# Patient Record
Sex: Female | Born: 1985
Health system: Southern US, Community
[De-identification: ages and names within clinical notes are randomized; demographics above are authoritative.]

## PROBLEM LIST (undated history)

## (undated) DIAGNOSIS — Z789 Other specified health status: Secondary | ICD-10-CM

## (undated) HISTORY — PX: NO PAST SURGERIES: SHX2092

---

## 2012-11-25 LAB — OB RESULTS CONSOLE HEPATITIS B SURFACE ANTIGEN: Hepatitis B Surface Ag: NEGATIVE

## 2012-11-25 LAB — OB RESULTS CONSOLE ANTIBODY SCREEN: ANTIBODY SCREEN: NEGATIVE

## 2012-11-25 LAB — OB RESULTS CONSOLE ABO/RH: RH Type: POSITIVE

## 2012-11-25 LAB — OB RESULTS CONSOLE RPR: RPR: NONREACTIVE

## 2012-11-25 LAB — OB RESULTS CONSOLE GC/CHLAMYDIA
Chlamydia: NEGATIVE
GC PROBE AMP, GENITAL: NEGATIVE

## 2012-11-25 LAB — OB RESULTS CONSOLE HIV ANTIBODY (ROUTINE TESTING): HIV: NONREACTIVE

## 2012-11-25 LAB — OB RESULTS CONSOLE RUBELLA ANTIBODY, IGM: Rubella: IMMUNE

## 2012-12-13 ENCOUNTER — Inpatient Hospital Stay (HOSPITAL_COMMUNITY): Admission: AD | Admit: 2012-12-13 | Payer: Self-pay | Source: Ambulatory Visit | Admitting: Obstetrics and Gynecology

## 2013-02-23 NOTE — L&D Delivery Note (Signed)
Delivery Note  SVD viable female Apgars 9,9 over 2nd degree ML episiotomy.  Placenta delivered spontaneously intact with 3VC. Repair with 2-0 Chromic with good support and hemostasis noted and R/V exam confirms.  PH art was sent.  [place] cord blood was not done.  Mother and baby were doing well.  EBL 350cc  Candice Campavid Shelisha Gautier, MD

## 2013-05-29 LAB — OB RESULTS CONSOLE GBS: GBS: NEGATIVE

## 2013-06-20 ENCOUNTER — Encounter (HOSPITAL_COMMUNITY): Payer: BC Managed Care – PPO | Admitting: Anesthesiology

## 2013-06-20 ENCOUNTER — Inpatient Hospital Stay (HOSPITAL_COMMUNITY)
Admission: AD | Admit: 2013-06-20 | Discharge: 2013-06-22 | DRG: 775 | Disposition: A | Discharge status: Home / Self Care | Payer: BC Managed Care – PPO | Source: Ambulatory Visit | Attending: Obstetrics and Gynecology | Admitting: Obstetrics and Gynecology

## 2013-06-20 ENCOUNTER — Encounter (HOSPITAL_COMMUNITY): Payer: Self-pay | Admitting: *Deleted

## 2013-06-20 ENCOUNTER — Inpatient Hospital Stay (HOSPITAL_COMMUNITY): Payer: BC Managed Care – PPO | Admitting: Anesthesiology

## 2013-06-20 DIAGNOSIS — O878 Other venous complications in the puerperium: Secondary | ICD-10-CM | POA: Diagnosis present

## 2013-06-20 DIAGNOSIS — K649 Unspecified hemorrhoids: Secondary | ICD-10-CM | POA: Diagnosis present

## 2013-06-20 DIAGNOSIS — Z8249 Family history of ischemic heart disease and other diseases of the circulatory system: Secondary | ICD-10-CM

## 2013-06-20 DIAGNOSIS — Z833 Family history of diabetes mellitus: Secondary | ICD-10-CM

## 2013-06-20 HISTORY — DX: Other specified health status: Z78.9

## 2013-06-20 LAB — CBC
HCT: 42.7 % (ref 36.0–46.0)
HEMOGLOBIN: 14.7 g/dL (ref 12.0–15.0)
MCH: 31.7 pg (ref 26.0–34.0)
MCHC: 34.4 g/dL (ref 30.0–36.0)
MCV: 92 fL (ref 78.0–100.0)
Platelets: 203 10*3/uL (ref 150–400)
RBC: 4.64 MIL/uL (ref 3.87–5.11)
RDW: 14.6 % (ref 11.5–15.5)
WBC: 13 10*3/uL — ABNORMAL HIGH (ref 4.0–10.5)

## 2013-06-20 LAB — RPR

## 2013-06-20 MED ORDER — CITRIC ACID-SODIUM CITRATE 334-500 MG/5ML PO SOLN: 30.0000 mL | ORAL | Status: DC | PRN

## 2013-06-20 MED ORDER — PHENYLEPHRINE 40 MCG/ML (10ML) SYRINGE FOR IV PUSH (FOR BLOOD PRESSURE SUPPORT)
80.0000 ug | PREFILLED_SYRINGE | INTRAVENOUS | Status: DC | PRN
  Filled 2013-06-20: qty 2
  Filled 2013-06-20: qty 10

## 2013-06-20 MED ORDER — ACETAMINOPHEN 325 MG PO TABS: 650.0000 mg | ORAL_TABLET | ORAL | Status: DC | PRN

## 2013-06-20 MED ORDER — OXYCODONE-ACETAMINOPHEN 5-325 MG PO TABS: 1.0000 | ORAL_TABLET | ORAL | Status: DC | PRN

## 2013-06-20 MED ORDER — LIDOCAINE HCL (PF) 1 % IJ SOLN
INTRAMUSCULAR | Status: DC | PRN
  Administered 2013-06-20 (×2): 8 mL

## 2013-06-20 MED ORDER — ONDANSETRON HCL 4 MG/2ML IJ SOLN
4.0000 mg | Freq: Four times a day (QID) | INTRAMUSCULAR | Status: DC | PRN
  Administered 2013-06-20: 4 mg via INTRAVENOUS
  Filled 2013-06-20: qty 2

## 2013-06-20 MED ORDER — OXYCODONE-ACETAMINOPHEN 5-325 MG PO TABS
1.0000 | ORAL_TABLET | ORAL | Status: DC | PRN
  Administered 2013-06-21: 1 via ORAL
  Filled 2013-06-20: qty 1

## 2013-06-20 MED ORDER — PRENATAL MULTIVITAMIN CH
1.0000 | ORAL_TABLET | Freq: Every day | ORAL | Status: DC
  Administered 2013-06-21: 1 via ORAL
  Filled 2013-06-20: qty 1

## 2013-06-20 MED ORDER — ONDANSETRON HCL 4 MG/2ML IJ SOLN: 4.0000 mg | INTRAMUSCULAR | Status: DC | PRN

## 2013-06-20 MED ORDER — LACTATED RINGERS IV SOLN
500.0000 mL | INTRAVENOUS | Status: DC | PRN
Start: 2013-06-20 — End: 2013-06-20

## 2013-06-20 MED ORDER — OXYTOCIN 40 UNITS IN LACTATED RINGERS INFUSION - SIMPLE MED: 62.5000 mL/h | INTRAVENOUS | Status: DC

## 2013-06-20 MED ORDER — ZOLPIDEM TARTRATE 5 MG PO TABS: 5.0000 mg | ORAL_TABLET | Freq: Every evening | ORAL | Status: DC | PRN

## 2013-06-20 MED ORDER — WITCH HAZEL-GLYCERIN EX PADS
1.0000 "application " | MEDICATED_PAD | CUTANEOUS | Status: DC | PRN
  Administered 2013-06-21: 1 via TOPICAL

## 2013-06-20 MED ORDER — IBUPROFEN 600 MG PO TABS
600.0000 mg | ORAL_TABLET | Freq: Four times a day (QID) | ORAL | Status: DC
  Administered 2013-06-20 – 2013-06-22 (×5): 600 mg via ORAL
  Filled 2013-06-20 (×6): qty 1

## 2013-06-20 MED ORDER — TETANUS-DIPHTH-ACELL PERTUSSIS 5-2.5-18.5 LF-MCG/0.5 IM SUSP: 0.5000 mL | Freq: Once | INTRAMUSCULAR | Status: DC

## 2013-06-20 MED ORDER — FLEET ENEMA 7-19 GM/118ML RE ENEM: 1.0000 | ENEMA | RECTAL | Status: DC | PRN

## 2013-06-20 MED ORDER — SENNOSIDES-DOCUSATE SODIUM 8.6-50 MG PO TABS
2.0000 | ORAL_TABLET | ORAL | Status: DC
  Administered 2013-06-20 – 2013-06-22 (×2): 2 via ORAL
  Filled 2013-06-20 (×2): qty 2

## 2013-06-20 MED ORDER — FENTANYL 2.5 MCG/ML BUPIVACAINE 1/10 % EPIDURAL INFUSION (WH - ANES)
INTRAMUSCULAR | Status: AC
  Filled 2013-06-20: qty 125

## 2013-06-20 MED ORDER — FENTANYL 2.5 MCG/ML BUPIVACAINE 1/10 % EPIDURAL INFUSION (WH - ANES)
INTRAMUSCULAR | Status: DC | PRN
  Administered 2013-06-20: 14 mL/h via EPIDURAL

## 2013-06-20 MED ORDER — LACTATED RINGERS IV SOLN
500.0000 mL | Freq: Once | INTRAVENOUS | Status: AC
  Administered 2013-06-20: 800 mL via INTRAVENOUS

## 2013-06-20 MED ORDER — TERBUTALINE SULFATE 1 MG/ML IJ SOLN: 0.2500 mg | Freq: Once | INTRAMUSCULAR | Status: DC | PRN

## 2013-06-20 MED ORDER — MEASLES, MUMPS & RUBELLA VAC ~~LOC~~ INJ: 0.5000 mL | INJECTION | Freq: Once | SUBCUTANEOUS | Status: DC

## 2013-06-20 MED ORDER — BENZOCAINE-MENTHOL 20-0.5 % EX AERO
1.0000 "application " | INHALATION_SPRAY | CUTANEOUS | Status: DC | PRN
  Filled 2013-06-20: qty 56

## 2013-06-20 MED ORDER — EPHEDRINE 5 MG/ML INJ
10.0000 mg | INTRAVENOUS | Status: DC | PRN
  Filled 2013-06-20: qty 2

## 2013-06-20 MED ORDER — MEDROXYPROGESTERONE ACETATE 150 MG/ML IM SUSP: 150.0000 mg | INTRAMUSCULAR | Status: DC | PRN

## 2013-06-20 MED ORDER — DIBUCAINE 1 % RE OINT: 1.0000 "application " | TOPICAL_OINTMENT | RECTAL | Status: DC | PRN

## 2013-06-20 MED ORDER — LIDOCAINE HCL (PF) 1 % IJ SOLN
30.0000 mL | INTRAMUSCULAR | Status: DC | PRN
  Filled 2013-06-20: qty 30

## 2013-06-20 MED ORDER — PHENYLEPHRINE 40 MCG/ML (10ML) SYRINGE FOR IV PUSH (FOR BLOOD PRESSURE SUPPORT)
80.0000 ug | PREFILLED_SYRINGE | INTRAVENOUS | Status: DC | PRN
  Filled 2013-06-20: qty 2

## 2013-06-20 MED ORDER — EPHEDRINE 5 MG/ML INJ
INTRAVENOUS | Status: AC
  Filled 2013-06-20: qty 4

## 2013-06-20 MED ORDER — SIMETHICONE 80 MG PO CHEW: 80.0000 mg | CHEWABLE_TABLET | ORAL | Status: DC | PRN

## 2013-06-20 MED ORDER — EPHEDRINE 5 MG/ML INJ
10.0000 mg | INTRAVENOUS | Status: DC | PRN
  Administered 2013-06-20: 10 mg via INTRAVENOUS
  Filled 2013-06-20: qty 2

## 2013-06-20 MED ORDER — DIPHENHYDRAMINE HCL 25 MG PO CAPS: 25.0000 mg | ORAL_CAPSULE | Freq: Four times a day (QID) | ORAL | Status: DC | PRN

## 2013-06-20 MED ORDER — OXYTOCIN BOLUS FROM INFUSION: 500.0000 mL | INTRAVENOUS | Status: DC

## 2013-06-20 MED ORDER — FENTANYL 2.5 MCG/ML BUPIVACAINE 1/10 % EPIDURAL INFUSION (WH - ANES)
14.0000 mL/h | INTRAMUSCULAR | Status: DC | PRN
  Administered 2013-06-20: 14 mL/h via EPIDURAL
  Filled 2013-06-20: qty 125

## 2013-06-20 MED ORDER — IBUPROFEN 600 MG PO TABS: 600.0000 mg | ORAL_TABLET | Freq: Four times a day (QID) | ORAL | Status: DC | PRN

## 2013-06-20 MED ORDER — ONDANSETRON HCL 4 MG PO TABS: 4.0000 mg | ORAL_TABLET | ORAL | Status: DC | PRN

## 2013-06-20 MED ORDER — DIPHENHYDRAMINE HCL 50 MG/ML IJ SOLN: 12.5000 mg | INTRAMUSCULAR | Status: DC | PRN

## 2013-06-20 MED ORDER — OXYTOCIN 40 UNITS IN LACTATED RINGERS INFUSION - SIMPLE MED
1.0000 m[IU]/min | INTRAVENOUS | Status: DC
  Administered 2013-06-20: 4 m[IU]/min via INTRAVENOUS
  Administered 2013-06-20: 2 m[IU]/min via INTRAVENOUS
  Filled 2013-06-20: qty 1000

## 2013-06-20 MED ORDER — LACTATED RINGERS IV SOLN
INTRAVENOUS | Status: DC
  Administered 2013-06-20: 1000 mL via INTRAVENOUS

## 2013-06-20 MED ORDER — LANOLIN HYDROUS EX OINT
TOPICAL_OINTMENT | CUTANEOUS | Status: DC | PRN
Start: 2013-06-20 — End: 2013-06-22

## 2013-06-20 NOTE — Anesthesia Preprocedure Evaluation (Signed)

## 2013-06-20 NOTE — H&P (Signed)
Kaneisha Virgie DadJhurry is a 28 y.o. female presenting for labor sxs.  Admitted this am in active labor.  Prg uncomplicated.  GBS-. History OB History   Grav Para Term Preterm Abortions TAB SAB Ect Mult Living   2    1 1          Past Medical History  Diagnosis Date  . Medical history non-contributory    Past Surgical History  Procedure Laterality Date  . No past surgeries     Family History: family history includes Diabetes in her father and mother; Hypertension in her father and mother. Social History:  reports that she has never smoked. She does not have any smokeless tobacco history on file. She reports that she does not drink alcohol or use illicit drugs.   Prenatal Transfer Tool  Maternal Diabetes: No Genetic Screening: Normal Maternal Ultrasounds/Referrals: Normal Fetal Ultrasounds or other Referrals:  None Maternal Substance Abuse:  No Significant Maternal Medications:  None Significant Maternal Lab Results:  None Other Comments:  None  ROS  Dilation: 4 Effacement (%): 100 Station: -1 Exam by:: Dr Rana SnareLowe Blood pressure 123/79, pulse 85, temperature 97.9 F (36.6 C), temperature source Oral, resp. rate 20, height 5\' 7"  (1.702 m), weight 78.926 kg (174 lb), SpO2 98.00%. Exam Physical Exam  Prenatal labs: ABO, Rh: O/Positive/-- (10/03 0000) Antibody: Negative (10/03 0000) Rubella: Immune (10/03 0000) RPR: Nonreactive (10/03 0000)  HBsAg: Negative (10/03 0000)  HIV: Non-reactive (10/03 0000)  GBS: Negative (04/06 0000)   Assessment/Plan: IUP term in active labor. Anticipate SVD   Turner DanielsDavid C Mario Voong 06/20/2013, 8:56 AM

## 2013-06-20 NOTE — MAU Note (Signed)
Contractions and back pain.

## 2013-06-20 NOTE — Anesthesia Procedure Notes (Signed)
Epidural Patient location during procedure: OB Start time: 06/20/2013 8:11 AM End time: 06/20/2013 8:15 AM  Staffing Anesthesiologist: Leilani AbleHATCHETT, Leilynn Pilat Performed by: anesthesiologist   Preanesthetic Checklist Completed: patient identified, surgical consent, pre-op evaluation, timeout performed, IV checked, risks and benefits discussed and monitors and equipment checked  Epidural Patient position: sitting Prep: site prepped and draped and DuraPrep Patient monitoring: continuous pulse ox and blood pressure Approach: midline Location: L3-L4 Injection technique: LOR air  Needle:  Needle type: Tuohy  Needle gauge: 17 G Needle length: 9 cm and 9 Needle insertion depth: 5 cm cm Catheter type: closed end flexible Catheter size: 19 Gauge Catheter at skin depth: 10 cm Test dose: negative and Other  Assessment Sensory level: T9 Events: blood not aspirated, injection not painful, no injection resistance, negative IV test and no paresthesia  Additional Notes Reason for block:procedure for pain

## 2013-06-20 NOTE — Lactation Note (Signed)
This note was copied from the chart of Terri Evans. Lactation Consultation Note  Patient Name: Terri Evans Today's Date: 06/20/2013 Reason for consult: Initial assessment of this primipara and her newborn at 5 hours of age.  Baby had initial LATCH score=10 and mom reports just finishing a feeding for 31 minutes but is asking for help swaddling baby.  LC demonstrated swaddling and reviewed other calming and comfort measures for baby between feedings.  LC encouraged STS and cue feedings.  Mom says she was shown how to perform hand expression to obtain colostrum and reports baby latching with strong sucking on both breasts.   LC provided [facility] Resource brochure and reviewed [facility] and list of community and web site resources. LC encouraged review of Baby and Me pp 9, 14 and 20-25 for STS and BF information.   Maternal Data Formula Feeding for Exclusion: No Infant to breast within first hour of birth: Yes (initial LATCH score=10) Has patient been taught Hand Expression?: Yes Does the patient have breastfeeding experience prior to this delivery?: No  Feeding Feeding Type: Breast Fed Length of feed: 31 min (reported by parents; 10 on one breast, 21 on other)  LATCH Score/Interventions           initial LATCH score=10 after delivery           Lactation Tools Discussed/Used   STS, cue feedings, hand expression Swaddling technique, calming and comfort measures for baby  Consult Status Consult Status: Follow-up Date: 06/21/13 Follow-up type: In-patient    Terri Evans 06/20/2013, 10:54 PM

## 2013-06-21 LAB — CBC
HEMATOCRIT: 34.6 % — AB (ref 36.0–46.0)
Hemoglobin: 11.9 g/dL — ABNORMAL LOW (ref 12.0–15.0)
MCH: 31.9 pg (ref 26.0–34.0)
MCHC: 34.4 g/dL (ref 30.0–36.0)
MCV: 92.8 fL (ref 78.0–100.0)
Platelets: 173 10*3/uL (ref 150–400)
RBC: 3.73 MIL/uL — ABNORMAL LOW (ref 3.87–5.11)
RDW: 14.8 % (ref 11.5–15.5)
WBC: 16.4 10*3/uL — ABNORMAL HIGH (ref 4.0–10.5)

## 2013-06-21 MED ORDER — HYDROCORTISONE ACE-PRAMOXINE 1-1 % RE FOAM
1.0000 | Freq: Two times a day (BID) | RECTAL | Status: DC
Start: 2013-06-21 — End: 2013-06-22
  Administered 2013-06-21: 1 via RECTAL
  Filled 2013-06-21: qty 10

## 2013-06-21 NOTE — Anesthesia Postprocedure Evaluation (Signed)
Anesthesia Post Note  Patient: Terri Evans  Procedure(s) Performed: * No procedures listed *  Anesthesia type: Epidural  Patient location: Mother/Baby  Post pain: Pain level controlled  Post assessment: Post-op Vital signs reviewed  Last Vitals:  Filed Vitals:   06/21/13 0110  BP: 97/62  Pulse: 96  Temp: 36.4 C  Resp: 18    Post vital signs: Reviewed  Level of consciousness:alert  Complications: No apparent anesthesia complications

## 2013-06-21 NOTE — Progress Notes (Signed)
Post Partum Day 1 Subjective: no complaints, up ad lib, voiding and tolerating PO  Objective: Blood pressure 97/62, pulse 96, temperature 97.5 F (36.4 C), temperature source Oral, resp. rate 18, height 5\' 7"  (1.702 m), weight 174 lb (78.926 kg), SpO2 98.00%, unknown if currently breastfeeding.  Physical Exam:  General: alert and cooperative Lochia: appropriate Uterine Fundus: firm Incision: perineum intact, small hemorrhoid noted DVT Evaluation: No evidence of DVT seen on physical exam. Negative Homan's sign. No cords or calf tenderness. No significant calf/ankle edema.   Recent Labs  06/20/13 0550 06/21/13 0612  HGB 14.7 11.9*  HCT 42.7 34.6*    Assessment/Plan: Plan for discharge tomorrow   LOS: 1 day   Terri Evans 06/21/2013, 8:01 AM

## 2013-06-22 MED ORDER — HYDROCORTISONE ACE-PRAMOXINE 1-1 % RE FOAM: 1.0000 | Freq: Two times a day (BID) | RECTAL | Status: DC

## 2013-06-22 MED ORDER — IBUPROFEN 600 MG PO TABS: 600.0000 mg | ORAL_TABLET | Freq: Four times a day (QID) | ORAL | Status: DC

## 2013-06-22 NOTE — Discharge Summary (Signed)
Obstetric Discharge Summary Reason for Admission: onset of labor Prenatal Procedures: ultrasound Intrapartum Procedures: spontaneous vaginal delivery Postpartum Procedures: none Complications-Operative and Postpartum: 2 degree perineal laceration Hemoglobin  Date Value Ref Range Status  06/21/2013 11.9* 12.0 - 15.0 g/dL Final     REPEATED TO VERIFY     DELTA CHECK NOTED     HCT  Date Value Ref Range Status  06/21/2013 34.6* 36.0 - 46.0 % Final    Physical Exam:  General: alert and cooperative Lochia: appropriate Uterine Fundus: firm Incision: healing well DVT Evaluation: No evidence of DVT seen on physical exam. Negative Homan's sign. No cords or calf tenderness. No significant calf/ankle edema.  Discharge Diagnoses: Term Pregnancy-delivered  Discharge Information: Date: 06/22/2013 Activity: pelvic rest Diet: routine Medications: PNV, Ibuprofen and proctofoam HC Condition: stable Instructions: refer to practice specific booklet Discharge to: home   Newborn Data: Live born female  Birth Weight: 7 lb 10 oz (3459 g) APGAR: 9, 9  Home with mother.  Terri BlonderCarol G [name] 06/22/2013, 8:09 AM

## 2013-06-22 NOTE — Lactation Note (Signed)
This note was copied from the chart of Terri Evans. Lactation Consultation Note  Patient Name: Terri Kit Virgie DadJhurry [id number]'s Date: 06/22/2013 Reason for consult: Follow-up assessment Follow-up appointment prior to discharge, baby 40 hours of life. Mom just finishing up nursing, mom and patients' nurse reports nursing/latching going well. Mom reports baby is cluster feeding. Mom denies nipple/breast pain. Mom encourage to feed baby with cues, and understands cluster feeding. Discussed engorgement prevention/treatment. Mom aware of OP/BFSG services, and enc to call with concerns. Mom asked for a hand pump, given with instructions. Mom referred to Baby and Me booklet for milk storage parameters and number of wet and soiled diapers to expect. Reviewed when to offer first bottle in preparation to return to work.   Maternal Data    Feeding Feeding Type:  (Mom just finished nursing.) Length of feed: 30 min  LATCH Score/Interventions Latch:  (Didn't see latch, mom and patient's nurse states baby latches well, nursing going well.)                    Lactation Tools Discussed/Used     Consult Status Consult Status: Complete Follow-up type: Call as needed    Sherlyn HayJennifer D Edynn Gillock 06/22/2013, 10:01 AM

## 2013-12-25 ENCOUNTER — Encounter (HOSPITAL_COMMUNITY): Payer: Self-pay | Admitting: *Deleted

## 2016-01-01 DIAGNOSIS — Z01419 Encounter for gynecological examination (general) (routine) without abnormal findings: Secondary | ICD-10-CM | POA: Diagnosis not present

## 2016-01-01 DIAGNOSIS — Z6823 Body mass index (BMI) 23.0-23.9, adult: Secondary | ICD-10-CM | POA: Diagnosis not present

## 2016-08-12 DIAGNOSIS — N911 Secondary amenorrhea: Secondary | ICD-10-CM | POA: Diagnosis not present

## 2016-08-21 DIAGNOSIS — Z3480 Encounter for supervision of other normal pregnancy, unspecified trimester: Secondary | ICD-10-CM | POA: Diagnosis not present

## 2016-08-21 DIAGNOSIS — Z3A09 9 weeks gestation of pregnancy: Secondary | ICD-10-CM | POA: Diagnosis not present

## 2016-08-21 DIAGNOSIS — Z3491 Encounter for supervision of normal pregnancy, unspecified, first trimester: Secondary | ICD-10-CM | POA: Diagnosis not present

## 2016-08-21 LAB — OB RESULTS CONSOLE ABO/RH: RH Type: POSITIVE

## 2016-08-21 LAB — OB RESULTS CONSOLE HIV ANTIBODY (ROUTINE TESTING): HIV: NONREACTIVE

## 2016-08-21 LAB — OB RESULTS CONSOLE RUBELLA ANTIBODY, IGM: RUBELLA: IMMUNE

## 2016-08-21 LAB — OB RESULTS CONSOLE RPR: RPR: NONREACTIVE

## 2016-08-21 LAB — OB RESULTS CONSOLE ANTIBODY SCREEN: ANTIBODY SCREEN: NEGATIVE

## 2016-08-21 LAB — OB RESULTS CONSOLE GC/CHLAMYDIA
Chlamydia: NEGATIVE
GC PROBE AMP, GENITAL: NEGATIVE

## 2016-08-21 LAB — OB RESULTS CONSOLE HEPATITIS B SURFACE ANTIGEN: HEP B S AG: NEGATIVE

## 2016-09-07 DIAGNOSIS — Z3480 Encounter for supervision of other normal pregnancy, unspecified trimester: Secondary | ICD-10-CM | POA: Diagnosis not present

## 2016-09-07 DIAGNOSIS — R300 Dysuria: Secondary | ICD-10-CM | POA: Diagnosis not present

## 2016-09-07 DIAGNOSIS — Z3491 Encounter for supervision of normal pregnancy, unspecified, first trimester: Secondary | ICD-10-CM | POA: Diagnosis not present

## 2016-09-07 DIAGNOSIS — Z113 Encounter for screening for infections with a predominantly sexual mode of transmission: Secondary | ICD-10-CM | POA: Diagnosis not present

## 2016-09-14 DIAGNOSIS — Z3682 Encounter for antenatal screening for nuchal translucency: Secondary | ICD-10-CM | POA: Diagnosis not present

## 2016-09-14 DIAGNOSIS — Z3491 Encounter for supervision of normal pregnancy, unspecified, first trimester: Secondary | ICD-10-CM | POA: Diagnosis not present

## 2016-09-14 DIAGNOSIS — Z36 Encounter for antenatal screening for chromosomal anomalies: Secondary | ICD-10-CM | POA: Diagnosis not present

## 2016-10-07 DIAGNOSIS — Z3480 Encounter for supervision of other normal pregnancy, unspecified trimester: Secondary | ICD-10-CM | POA: Diagnosis not present

## 2016-10-07 DIAGNOSIS — Z363 Encounter for antenatal screening for malformations: Secondary | ICD-10-CM | POA: Diagnosis not present

## 2016-10-07 DIAGNOSIS — Z3A16 16 weeks gestation of pregnancy: Secondary | ICD-10-CM | POA: Diagnosis not present

## 2016-10-29 DIAGNOSIS — Z363 Encounter for antenatal screening for malformations: Secondary | ICD-10-CM | POA: Diagnosis not present

## 2016-11-30 DIAGNOSIS — O4692 Antepartum hemorrhage, unspecified, second trimester: Secondary | ICD-10-CM | POA: Diagnosis not present

## 2016-11-30 DIAGNOSIS — Z3A23 23 weeks gestation of pregnancy: Secondary | ICD-10-CM | POA: Diagnosis not present

## 2016-12-28 DIAGNOSIS — Z3480 Encounter for supervision of other normal pregnancy, unspecified trimester: Secondary | ICD-10-CM | POA: Diagnosis not present

## 2016-12-28 DIAGNOSIS — Z23 Encounter for immunization: Secondary | ICD-10-CM | POA: Diagnosis not present

## 2017-02-23 NOTE — L&D Delivery Note (Signed)
  CTSP for fetal bradycardia to 50 bpm x 5 minutes.  Pt now complete (rapidly).  FHR not responding to postion change or IVF  Vtx at +2 station and LOA.  Pt desires VE.  I discussed the R&B of VE including but not limited to injury to fetus but the potential benefit of expedited delivery.  She gives her informed consent and wishes to proceed. On the 1st pull delivered viable female apgars 9,9 over 2nd degree perineal laceration   Placenta delivered spontaneously intact with 3VC.  Appeared to have marginal placental clot, and gush of blood was noted at delivery c/w partial abruption Repair with 2-0 Chromic with good support and hemostasis noted and R/V exam confirms.  PH art was sent..  Mother and baby were doing well.  EBL 400cc  Terri Campavid [name], MD

## 2017-02-25 DIAGNOSIS — Z3685 Encounter for antenatal screening for Streptococcus B: Secondary | ICD-10-CM | POA: Diagnosis not present

## 2017-02-25 DIAGNOSIS — Z3480 Encounter for supervision of other normal pregnancy, unspecified trimester: Secondary | ICD-10-CM | POA: Diagnosis not present

## 2017-03-12 ENCOUNTER — Encounter (HOSPITAL_COMMUNITY): Payer: Self-pay | Admitting: *Deleted

## 2017-03-12 ENCOUNTER — Telehealth (HOSPITAL_COMMUNITY): Payer: Self-pay | Admitting: *Deleted

## 2017-03-12 LAB — OB RESULTS CONSOLE GBS: GBS: POSITIVE

## 2017-03-12 NOTE — Telephone Encounter (Signed)
Preadmission screen  

## 2017-03-18 ENCOUNTER — Encounter (HOSPITAL_COMMUNITY): Payer: Self-pay | Admitting: *Deleted

## 2017-03-18 ENCOUNTER — Inpatient Hospital Stay (HOSPITAL_COMMUNITY): Payer: BLUE CROSS/BLUE SHIELD | Admitting: Anesthesiology

## 2017-03-18 ENCOUNTER — Inpatient Hospital Stay (HOSPITAL_COMMUNITY): Payer: BLUE CROSS/BLUE SHIELD

## 2017-03-18 ENCOUNTER — Inpatient Hospital Stay (HOSPITAL_COMMUNITY)
Admission: AD | Admit: 2017-03-18 | Discharge: 2017-03-20 | DRG: 805 | Disposition: A | Discharge status: Home / Self Care | Payer: BLUE CROSS/BLUE SHIELD | Source: Ambulatory Visit | Attending: Obstetrics and Gynecology | Admitting: Obstetrics and Gynecology

## 2017-03-18 DIAGNOSIS — O4593 Premature separation of placenta, unspecified, third trimester: Secondary | ICD-10-CM | POA: Diagnosis present

## 2017-03-18 DIAGNOSIS — O99824 Streptococcus B carrier state complicating childbirth: Secondary | ICD-10-CM | POA: Diagnosis present

## 2017-03-18 DIAGNOSIS — O288 Other abnormal findings on antenatal screening of mother: Secondary | ICD-10-CM

## 2017-03-18 DIAGNOSIS — O479 False labor, unspecified: Secondary | ICD-10-CM

## 2017-03-18 DIAGNOSIS — Z3A39 39 weeks gestation of pregnancy: Secondary | ICD-10-CM

## 2017-03-18 DIAGNOSIS — O4103X0 Oligohydramnios, third trimester, not applicable or unspecified: Secondary | ICD-10-CM | POA: Diagnosis not present

## 2017-03-18 DIAGNOSIS — Z3A00 Weeks of gestation of pregnancy not specified: Secondary | ICD-10-CM | POA: Diagnosis not present

## 2017-03-18 DIAGNOSIS — Z3483 Encounter for supervision of other normal pregnancy, third trimester: Secondary | ICD-10-CM | POA: Diagnosis not present

## 2017-03-18 LAB — TYPE AND SCREEN
ABO/RH(D): O POS
Antibody Screen: NEGATIVE

## 2017-03-18 LAB — CBC
HEMATOCRIT: 40.6 % (ref 36.0–46.0)
Hemoglobin: 13.9 g/dL (ref 12.0–15.0)
MCH: 31.6 pg (ref 26.0–34.0)
MCHC: 34.2 g/dL (ref 30.0–36.0)
MCV: 92.3 fL (ref 78.0–100.0)
PLATELETS: 216 10*3/uL (ref 150–400)
RBC: 4.4 MIL/uL (ref 3.87–5.11)
RDW: 14.8 % (ref 11.5–15.5)
WBC: 9.9 10*3/uL (ref 4.0–10.5)

## 2017-03-18 LAB — ABO/RH: ABO/RH(D): O POS

## 2017-03-18 MED ORDER — MEDROXYPROGESTERONE ACETATE 150 MG/ML IM SUSP: 150.0000 mg | INTRAMUSCULAR | Status: DC | PRN

## 2017-03-18 MED ORDER — TERBUTALINE SULFATE 1 MG/ML IJ SOLN: 0.2500 mg | Freq: Once | INTRAMUSCULAR | Status: DC | PRN

## 2017-03-18 MED ORDER — DIPHENHYDRAMINE HCL 25 MG PO CAPS: 25.0000 mg | ORAL_CAPSULE | Freq: Four times a day (QID) | ORAL | Status: DC | PRN

## 2017-03-18 MED ORDER — OXYCODONE-ACETAMINOPHEN 5-325 MG PO TABS: 1.0000 | ORAL_TABLET | ORAL | Status: DC | PRN

## 2017-03-18 MED ORDER — OXYTOCIN 40 UNITS IN LACTATED RINGERS INFUSION - SIMPLE MED
1.0000 m[IU]/min | INTRAVENOUS | Status: DC
  Administered 2017-03-18: 2 m[IU]/min via INTRAVENOUS

## 2017-03-18 MED ORDER — LIDOCAINE HCL (PF) 1 % IJ SOLN
INTRAMUSCULAR | Status: DC | PRN
  Administered 2017-03-18: 6 mL

## 2017-03-18 MED ORDER — SOD CITRATE-CITRIC ACID 500-334 MG/5ML PO SOLN: 30.0000 mL | ORAL | Status: DC | PRN

## 2017-03-18 MED ORDER — LACTATED RINGERS IV SOLN
INTRAVENOUS | Status: DC
  Administered 2017-03-18: 07:00:00 via INTRAVENOUS

## 2017-03-18 MED ORDER — LACTATED RINGERS IV SOLN
500.0000 mL | Freq: Once | INTRAVENOUS | Status: AC
  Administered 2017-03-18: 500 mL via INTRAVENOUS

## 2017-03-18 MED ORDER — SENNOSIDES-DOCUSATE SODIUM 8.6-50 MG PO TABS
2.0000 | ORAL_TABLET | ORAL | Status: DC
  Administered 2017-03-18 – 2017-03-20 (×2): 2 via ORAL
  Filled 2017-03-18 (×2): qty 2

## 2017-03-18 MED ORDER — OXYCODONE-ACETAMINOPHEN 5-325 MG PO TABS: 2.0000 | ORAL_TABLET | ORAL | Status: DC | PRN

## 2017-03-18 MED ORDER — ACETAMINOPHEN 325 MG PO TABS
650.0000 mg | ORAL_TABLET | ORAL | Status: DC | PRN
  Administered 2017-03-18: 650 mg via ORAL
  Filled 2017-03-18: qty 2

## 2017-03-18 MED ORDER — DIPHENHYDRAMINE HCL 50 MG/ML IJ SOLN: 12.5000 mg | INTRAMUSCULAR | Status: DC | PRN

## 2017-03-18 MED ORDER — BENZOCAINE-MENTHOL 20-0.5 % EX AERO
1.0000 "application " | INHALATION_SPRAY | CUTANEOUS | Status: DC | PRN
  Administered 2017-03-18: 1 via TOPICAL
  Filled 2017-03-18: qty 56

## 2017-03-18 MED ORDER — LACTATED RINGERS IV SOLN
500.0000 mL | INTRAVENOUS | Status: DC | PRN
  Administered 2017-03-18: 500 mL via INTRAVENOUS

## 2017-03-18 MED ORDER — COCONUT OIL OIL: 1.0000 "application " | TOPICAL_OIL | Status: DC | PRN

## 2017-03-18 MED ORDER — ACETAMINOPHEN 325 MG PO TABS: 650.0000 mg | ORAL_TABLET | ORAL | Status: DC | PRN

## 2017-03-18 MED ORDER — ONDANSETRON HCL 4 MG/2ML IJ SOLN: 4.0000 mg | Freq: Four times a day (QID) | INTRAMUSCULAR | Status: DC | PRN

## 2017-03-18 MED ORDER — OXYTOCIN 40 UNITS IN LACTATED RINGERS INFUSION - SIMPLE MED
INTRAVENOUS | Status: AC
  Filled 2017-03-18: qty 1000

## 2017-03-18 MED ORDER — DIBUCAINE 1 % RE OINT: 1.0000 "application " | TOPICAL_OINTMENT | RECTAL | Status: DC | PRN

## 2017-03-18 MED ORDER — TETANUS-DIPHTH-ACELL PERTUSSIS 5-2.5-18.5 LF-MCG/0.5 IM SUSP: 0.5000 mL | Freq: Once | INTRAMUSCULAR | Status: DC

## 2017-03-18 MED ORDER — LACTATED RINGERS IV SOLN: INTRAVENOUS | Status: DC

## 2017-03-18 MED ORDER — ONDANSETRON HCL 4 MG PO TABS: 4.0000 mg | ORAL_TABLET | ORAL | Status: DC | PRN

## 2017-03-18 MED ORDER — OXYTOCIN BOLUS FROM INFUSION: 500.0000 mL | Freq: Once | INTRAVENOUS | Status: DC

## 2017-03-18 MED ORDER — IBUPROFEN 600 MG PO TABS
600.0000 mg | ORAL_TABLET | Freq: Four times a day (QID) | ORAL | Status: DC
  Administered 2017-03-18 – 2017-03-20 (×7): 600 mg via ORAL
  Filled 2017-03-18 (×7): qty 1

## 2017-03-18 MED ORDER — MEASLES, MUMPS & RUBELLA VAC ~~LOC~~ INJ: 0.5000 mL | INJECTION | Freq: Once | SUBCUTANEOUS | Status: DC

## 2017-03-18 MED ORDER — WITCH HAZEL-GLYCERIN EX PADS: 1.0000 "application " | MEDICATED_PAD | CUTANEOUS | Status: DC | PRN

## 2017-03-18 MED ORDER — ONDANSETRON HCL 4 MG/2ML IJ SOLN
4.0000 mg | INTRAMUSCULAR | Status: DC | PRN
Start: 2017-03-18 — End: 2017-03-20

## 2017-03-18 MED ORDER — LIDOCAINE HCL (PF) 1 % IJ SOLN: 30.0000 mL | INTRAMUSCULAR | Status: DC | PRN

## 2017-03-18 MED ORDER — PRENATAL MULTIVITAMIN CH
1.0000 | ORAL_TABLET | Freq: Every day | ORAL | Status: DC
  Administered 2017-03-19: 1 via ORAL
  Filled 2017-03-18: qty 1

## 2017-03-18 MED ORDER — SIMETHICONE 80 MG PO CHEW: 80.0000 mg | CHEWABLE_TABLET | ORAL | Status: DC | PRN

## 2017-03-18 MED ORDER — EPHEDRINE 5 MG/ML INJ: 10.0000 mg | INTRAVENOUS | Status: DC | PRN

## 2017-03-18 MED ORDER — PHENYLEPHRINE 40 MCG/ML (10ML) SYRINGE FOR IV PUSH (FOR BLOOD PRESSURE SUPPORT): 80.0000 ug | PREFILLED_SYRINGE | INTRAVENOUS | Status: DC | PRN

## 2017-03-18 MED ORDER — FLEET ENEMA 7-19 GM/118ML RE ENEM: 1.0000 | ENEMA | RECTAL | Status: DC | PRN

## 2017-03-18 MED ORDER — OXYTOCIN 40 UNITS IN LACTATED RINGERS INFUSION - SIMPLE MED: 2.5000 [IU]/h | INTRAVENOUS | Status: DC

## 2017-03-18 MED ORDER — PHENYLEPHRINE 40 MCG/ML (10ML) SYRINGE FOR IV PUSH (FOR BLOOD PRESSURE SUPPORT)
80.0000 ug | PREFILLED_SYRINGE | INTRAVENOUS | Status: DC | PRN
  Filled 2017-03-18 (×2): qty 10

## 2017-03-18 MED ORDER — PENICILLIN G POT IN DEXTROSE 60000 UNIT/ML IV SOLN
3.0000 10*6.[IU] | INTRAVENOUS | Status: DC
  Filled 2017-03-18 (×4): qty 50

## 2017-03-18 MED ORDER — FENTANYL 2.5 MCG/ML BUPIVACAINE 1/10 % EPIDURAL INFUSION (WH - ANES)
14.0000 mL/h | INTRAMUSCULAR | Status: DC | PRN
  Administered 2017-03-18: 14 mL/h via EPIDURAL
  Filled 2017-03-18: qty 100

## 2017-03-18 MED ORDER — PENICILLIN G POTASSIUM 5000000 UNITS IJ SOLR
5.0000 10*6.[IU] | Freq: Once | INTRAVENOUS | Status: AC
  Administered 2017-03-18: 5 10*6.[IU] via INTRAVENOUS
  Filled 2017-03-18: qty 5

## 2017-03-18 MED ORDER — ZOLPIDEM TARTRATE 5 MG PO TABS: 5.0000 mg | ORAL_TABLET | Freq: Every evening | ORAL | Status: DC | PRN

## 2017-03-18 NOTE — Anesthesia Preprocedure Evaluation (Addendum)
Anesthesia Evaluation  Patient identified by MRN, date of birth, ID band Patient awake    Airway Mallampati: II  TM Distance: >3 FB Neck ROM: Full    Dental no notable dental hx.    Pulmonary neg pulmonary ROS,    Pulmonary exam normal breath sounds clear to auscultation       Cardiovascular negative cardio ROS Normal cardiovascular exam Rhythm:Regular Rate:Normal     Neuro/Psych    GI/Hepatic   Endo/Other    Renal/GU      Musculoskeletal   Abdominal   Peds  Hematology   Anesthesia Other Findings   Reproductive/Obstetrics (+) Pregnancy                             Anesthesia Physical Anesthesia Plan  ASA: II  Anesthesia Plan: Epidural   Post-op Pain Management:    Induction:   PONV Risk Score and Plan:   Airway Management Planned:   Additional Equipment:   Intra-op Plan:   Post-operative Plan:   Informed Consent: I have reviewed the patients History and Physical, chart, labs and discussed the procedure including the risks, benefits and alternatives for the proposed anesthesia with the patient or authorized representative who has indicated his/her understanding and acceptance.     Plan Discussed with:   Anesthesia Plan Comments:         Anesthesia Quick Evaluation

## 2017-03-18 NOTE — Progress Notes (Signed)
This note also relates to the following rows which could not be included: BP - Cannot attach notes to unvalidated device data Pulse Rate - Cannot attach notes to unvalidated device data  Lactation , [name] in room.

## 2017-03-18 NOTE — Addendum Note (Signed)
Addendum  created 03/18/17 1718 by Shanon PayorGregory, Zenia Guest M, CRNA   Charge Capture section accepted, Sign clinical note

## 2017-03-18 NOTE — Anesthesia Procedure Notes (Signed)
Epidural Patient location during procedure: OB Start time: 03/18/2017 10:15 AM End time: 03/18/2017 10:20 AM  Staffing Anesthesiologist: Trevor IhaHouser, Stephen A, MD Performed: anesthesiologist   Preanesthetic Checklist Completed: patient identified, site marked, surgical consent, pre-op evaluation, timeout performed, IV checked, risks and benefits discussed and monitors and equipment checked  Epidural Patient position: sitting Prep: site prepped and draped and DuraPrep Patient monitoring: continuous pulse ox and blood pressure Approach: midline Location: L3-L4 Injection technique: LOR air  Needle:  Needle type: Tuohy  Needle gauge: 17 G Needle length: 9 cm and 9 Needle insertion depth: 5 cm cm Catheter type: closed end flexible Catheter size: 19 Gauge Catheter at skin depth: 10 cm Test dose: negative  Assessment Events: blood not aspirated, injection not painful, no injection resistance, negative IV test and no paresthesia

## 2017-03-18 NOTE — Anesthesia Postprocedure Evaluation (Signed)
Anesthesia Post Note  Patient: Terri Evans  Procedure(s) Performed: AN AD HOC LABOR EPIDURAL     Patient location during evaluation: L&D Anesthesia Type: Epidural Level of consciousness: awake and alert Pain management: pain level controlled Vital Signs Assessment: post-procedure vital signs reviewed and stable Respiratory status: spontaneous breathing and nonlabored ventilation Cardiovascular status: stable and blood pressure returned to baseline Postop Assessment: no headache, no backache, epidural receding, no apparent nausea or vomiting and patient able to bend at knees Anesthetic complications: no    Last Vitals:  Vitals:   03/18/17 1216 03/18/17 1230  BP: (!) 104/59 120/61  Pulse: 81 82  Resp:    Temp:    SpO2:      Last Pain:  Vitals:   03/18/17 1048  TempSrc:   PainSc: 2    Pain Goal: Patients Stated Pain Goal: 2 (03/18/17 0955)               Trevor IhaStephen A Olanrewaju Osborn

## 2017-03-18 NOTE — Progress Notes (Signed)
Lactation still in room with pt.

## 2017-03-18 NOTE — H&P (Signed)
Terri Evans is a 32 y.o. female presenting for labor sxs which resolved.  Had several variable decels so had BPP and noted AFI less than 3%.  Preg uncomplicated except GBS+. OB History    Gravida Para Term Preterm AB Living   3 1 1   1 1    SAB TAB Ectopic Multiple Live Births     1     1     Past Medical History:  Diagnosis Date  . Medical history non-contributory    Past Surgical History:  Procedure Laterality Date  . NO PAST SURGERIES     Family History: family history includes Diabetes in her father and mother; Heart disease in her maternal grandmother; Hypertension in her father and mother. Social History:  reports that  has never smoked. She does not have any smokeless tobacco history on file. She reports that she does not drink alcohol or use drugs.     Maternal Diabetes: No Genetic Screening: Normal Maternal Ultrasounds/Referrals: Normal Fetal Ultrasounds or other Referrals:  None Maternal Substance Abuse:  No Significant Maternal Medications:  None Significant Maternal Lab Results:  None Other Comments:  None  ROS History Dilation: 3 Effacement (%): Thick Exam by:: Rockwell GermanyLauren Fields, RN Blood pressure 113/84, pulse 90, temperature 98.5 F (36.9 C), resp. rate 19, height 5\' 7"  (1.702 m), weight 174 lb (78.9 kg), last menstrual period 06/16/2016, unknown if currently breastfeeding. Exam Physical Exam  Prenatal labs: ABO, Rh: --/--/O POS (01/24 0645) Antibody: NEG (01/24 0645) Rubella: Immune (06/29 0000) RPR: Nonreactive (06/29 0000)  HBsAg: Negative (06/29 0000)  HIV: Non-reactive (06/29 0000)  GBS: Positive (01/18 0000)   Assessment/Plan: IUP at term Oligohydraminos GBS+ Plan Pitocin IOL, Abx and AROM Anticipate SVD DL   Tameron Lama C 4/09/81191/24/2019, 8:38 AM

## 2017-03-18 NOTE — Lactation Note (Addendum)
This note was copied from a baby's chart. Lactation Consultation Note  Patient Name: Terri Evans Reason for consult: Initial assessment;Mother's request;Term   [facility]. Mm Bf her 34 yo for 15 months. Mom had infant latched to left breast in the cradle hold, infant was noted to have flanged lips with a shallow latch. Attempted to relatch to the breast and infant still wanted to be shallow. Mom's nipple was slightly compressed after feeding.   Assisted mom in latching infant to the left breast in the cross cradle hold. Infant was much deeper. Showed mom the difference in the latch and the deeper latch is preferred. Mom denied pain with latch.   Enc mom to feed infant STS 8-12 x in 24 hours at first feeding cues. Enc mom to massage/compress breast throughout the feeding. Enc mom to offer both breast with each feeding and to allow infant to feed as long as she wants. Showed mom how to hand express, colostrum very easily expressible. Enc mom to hand express before latch to get milk flowing and after the latch to spoon feed infant and to apply to nipple. Reviewed using head and pillow support with feedings. Reviewed colostrum and milk coming to volume.  BF Resources handout and LC Brochure given, mom informed of IP/OP services, BF Support Groups and LC Phone #. Enc mom to call out for feeding assistance as needed.   Parents report all questions/concerns have been answered.   Maternal Data Formula Feeding for Exclusion: No Has patient been taught Hand Expression?: Yes Does the patient have breastfeeding experience prior to this delivery?: Yes  Feeding Feeding Type: Breast Fed Length of feed: 20 min(relatched to right breast as LC was leaving room)  LATCH Score Latch: Grasps breast easily, tongue down, lips flanged, rhythmical sucking.  Audible Swallowing: Spontaneous and intermittent  Type of Nipple: Everted at rest and after  stimulation  Comfort (Breast/Nipple): Soft / non-tender  Hold (Positioning): Assistance needed to correctly position infant at breast and maintain latch.  LATCH Score: 9  Interventions Interventions: Breast feeding basics reviewed;Adjust position;Assisted with latch;Support pillows;Skin to skin;Position options;Breast massage;Breast compression;Hand express;Expressed milk  Lactation Tools Discussed/Used     Consult Status Consult Status: Follow-up Date: 03/19/17 Follow-up type: In-patient    Silas FloodSharon S Keliah Harned Evans, 12:55 PM

## 2017-03-18 NOTE — Anesthesia Postprocedure Evaluation (Signed)
Anesthesia Post Note  Patient: Terri Evans  Procedure(s) Performed: AN AD HOC LABOR EPIDURAL     Patient location during evaluation: Mother Baby Anesthesia Type: Epidural Level of consciousness: awake and alert and oriented Pain management: satisfactory to patient Vital Signs Assessment: post-procedure vital signs reviewed and stable Respiratory status: spontaneous breathing and nonlabored ventilation Cardiovascular status: stable Postop Assessment: no headache, no backache, no signs of nausea or vomiting, adequate PO intake and patient able to bend at knees (patient up walking) Anesthetic complications: no    Last Vitals:  Vitals:   03/18/17 1400 03/18/17 1513  BP: 112/61 117/61  Pulse: 78 (!) 103  Resp: 16 17  Temp: 36.8 C 36.4 C  SpO2:  100%    Last Pain:  Vitals:   03/18/17 1513  TempSrc: Axillary  PainSc:    Pain Goal: Patients Stated Pain Goal: 3 (03/18/17 1400)               Casson Catena

## 2017-03-18 NOTE — MAU Note (Signed)
CTX 6 minutes apart for the last hour.  States she feels like they have gone away.  No LOF. Having some scant brown discharge since her cervix was checked yesterday. States she was 2.5 cm then.

## 2017-03-19 LAB — RPR: RPR: NONREACTIVE

## 2017-03-19 LAB — CBC
HEMATOCRIT: 35.2 % — AB (ref 36.0–46.0)
HEMOGLOBIN: 11.9 g/dL — AB (ref 12.0–15.0)
MCH: 31.3 pg (ref 26.0–34.0)
MCHC: 33.8 g/dL (ref 30.0–36.0)
MCV: 92.6 fL (ref 78.0–100.0)
Platelets: 175 10*3/uL (ref 150–400)
RBC: 3.8 MIL/uL — ABNORMAL LOW (ref 3.87–5.11)
RDW: 14.9 % (ref 11.5–15.5)
WBC: 13.2 10*3/uL — ABNORMAL HIGH (ref 4.0–10.5)

## 2017-03-19 NOTE — Lactation Note (Signed)
This note was copied from a baby's chart. Lactation Consultation Note  Patient Name: Terri Evans DadJhurry [id number]'UToday's Date: 03/19/2017 Reason for consult: Follow-up assessment   Follow up with mom of 26 hour old infant. Infant with 11 BF for 10-40 minutes, 2 voids and 4 stools in the last 24 hours. Infant weight 6 lb 10.5 oz with weight loss of 1% since birth. LATCH scores 7. Infant in crib and starting to stir, family having lunch.   Mom reports she feels infant is feeding well. Dad was concerned that infant will feed up to 40 minutes and wondered if that was too long, discussed that as long as infant is actively feeding, she should be allowed to feed. Mom reports infant is actively feeding. Mom is hand expressing before latching and is seeing colostrum. Enc parents to continue to latch with feeding cues and to stimulate infant as needed to maintain suckling. Mom denies nipple pain/tenderness with feedings.   Mom asked if she should start pumping, Enc mom to exclusively BF infant for at least the first 2-3 weeks unless supplement is medically indicated. Mom voiced understanding.   Enc mom to call out for feeding assistance as needed. Mom and dad voiced understanding. Parents report no further questions/concerns at this time.    Maternal Data Formula Feeding for Exclusion: No Has patient been taught Hand Expression?: Yes Does the patient have breastfeeding experience prior to this delivery?: Yes  Feeding Feeding Type: Breast Fed Length of feed: 30 min  LATCH Score                   Interventions Interventions: Breast feeding basics reviewed;Breast massage;Breast compression;Hand express  Lactation Tools Discussed/Used     Consult Status Consult Status: Follow-up Date: 03/20/17 Follow-up type: In-patient    Silas FloodSharon S Hice 03/19/2017, 1:47 PM

## 2017-03-19 NOTE — Progress Notes (Signed)
Post Partum Day 1 Subjective: no complaints, up ad lib and voiding  Objective: Blood pressure 95/67, pulse 77, temperature 97.8 F (36.6 C), temperature source Oral, resp. rate 14, height 5\' 7"  (1.702 m), weight 174 lb (78.9 kg), last menstrual period 06/16/2016, SpO2 100 %, unknown if currently breastfeeding.  Physical Exam:  General: alert, cooperative and appears stated age Lochia: appropriate Uterine Fundus: firm Incision: healing well, no significant drainage, no dehiscence DVT Evaluation: No evidence of DVT seen on physical exam. Negative Homan's sign. No cords or calf tenderness.  Recent Labs    03/18/17 0645 03/19/17 0523  HGB 13.9 11.9*  HCT 40.6 35.2*    Assessment/Plan: Plan for discharge tomorrow   LOS: 1 day   Terri Evans 03/19/2017, 8:28 AM

## 2017-03-20 ENCOUNTER — Other Ambulatory Visit: Payer: Self-pay

## 2017-03-20 MED ORDER — IBUPROFEN 600 MG PO TABS: 600.0000 mg | ORAL_TABLET | Freq: Four times a day (QID) | ORAL | 0 refills | Status: DC

## 2017-03-20 NOTE — Discharge Instructions (Signed)
Call MD for T>100.4, heavy vaginal bleeding, or respiratory distress.  Call office to schedule postpartum visit in 6 weeks.  Pelvic rest x 6 weeks.  °

## 2017-03-20 NOTE — Lactation Note (Signed)
This note was copied from a baby's chart. Lactation Consultation Note  Patient Name: Terri Evans Today's Date: 03/20/2017  Mom reports that baby has been cluster feeding.  Reassured and discussed milk coming to volume.  Instructed on engorgement treatment.  Questions answered.  Lactation outpatient services reviewed and encouraged prn.   Maternal Data    Feeding Feeding Type: Breast Fed Length of feed: 20 min  LATCH Score                   Interventions    Lactation Tools Discussed/Used     Consult Status      Huston FoleyMOULDEN, Kalob Bergen S 03/20/2017, 8:12 AM

## 2017-03-20 NOTE — Discharge Summary (Signed)
Obstetric Discharge Summary Reason for Admission: induction of labor Prenatal Procedures: none Intrapartum Procedures: vacuum Postpartum Procedures: none Complications-Operative and Postpartum: 2nd degree perineal laceration Hemoglobin  Date Value Ref Range Status  03/19/2017 11.9 (L) 12.0 - 15.0 g/dL Final   HCT  Date Value Ref Range Status  03/19/2017 35.2 (L) 36.0 - 46.0 % Final    Physical Exam:  General: alert, cooperative and appears stated age 6Lochia: appropriate Uterine Fundus: firm Incision: healing well, no significant drainage, no dehiscence DVT Evaluation: No evidence of DVT seen on physical exam. Negative Homan's sign. No cords or calf tenderness.  Discharge Diagnoses: Term Pregnancy-delivered  Discharge Information: Date: 03/20/2017 Activity: pelvic rest Diet: routine Medications: PNV and Ibuprofen Condition: stable Instructions: refer to practice specific booklet Discharge to: home   Newborn Data: Live born female  Birth Weight: 6 lb 10 oz (3005 g) APGAR: 9, 9  Newborn Delivery   Birth date/time:  03/18/2017 11:35:00 Delivery type:  Vaginal, Spontaneous     Home with mother.  Terri Evans 03/20/2017, 8:44 AM

## 2017-03-23 ENCOUNTER — Inpatient Hospital Stay (HOSPITAL_COMMUNITY): Payer: BLUE CROSS/BLUE SHIELD

## 2017-04-27 DIAGNOSIS — Z1389 Encounter for screening for other disorder: Secondary | ICD-10-CM | POA: Diagnosis not present

## 2017-11-04 DIAGNOSIS — K120 Recurrent oral aphthae: Secondary | ICD-10-CM | POA: Diagnosis not present

## 2017-12-03 DIAGNOSIS — R8290 Unspecified abnormal findings in urine: Secondary | ICD-10-CM | POA: Diagnosis not present

## 2017-12-03 DIAGNOSIS — Z0000 Encounter for general adult medical examination without abnormal findings: Secondary | ICD-10-CM | POA: Diagnosis not present

## 2018-11-02 IMAGING — US US MFM FETAL BPP W/O NON-STRESS
1 series · 12 of 20 positions shown · non-contrast
Comparison: none

MAU/Triage

1  GIORGI JUMPER          [id number]      [id number]     [id number]
Indications
39 weeks gestation of pregnancy
Non-reactive NST, FHR decelerations
Oligohydraminios, third trimester, unspecified
OB History
Gravidity:    3         Term:   1        Prem:   0        SAB:   1
TOP:          0        Living:  1
Fetal Evaluation
Num Of Fetuses:     1
Fetal Heart         151
Rate(bpm):
Cardiac Activity:   Observed
Presentation:       Cephalic
Amniotic Fluid
AFI FV:      Oligohydramnios
AFI Sum(cm)     %Tile       Largest Pocket(cm)
3.3             < 3
RUQ(cm)       RLQ(cm)
1.5
Comment:    [DATE] in 9 minutes.
Biophysical Evaluation
Amniotic F.V:   Pocket < 2 cm two          F. Tone:        Observed
planes
F. Movement:    Observed                   Score:          [DATE]
F. Breathing:   Observed
Gestational Age
LMP:           39w 2d        Date:  06/16/16                 EDD:   03/23/17
Best:          39w 2d     Det. By:  LMP  (06/16/16)          EDD:   03/23/17
Anatomy
Stomach:               Appears normal, left   Bladder:                Appears normal
sided
Impression
INDICATION: 31 yr old [id number] at 46w4d for BPP secondary to
decelerations on fetal tracing. Remote read.

[Series 1: us mfm fetal bpp w/o non-stress · 20 acquisitions, 12 frames shown]
[im 1/20]
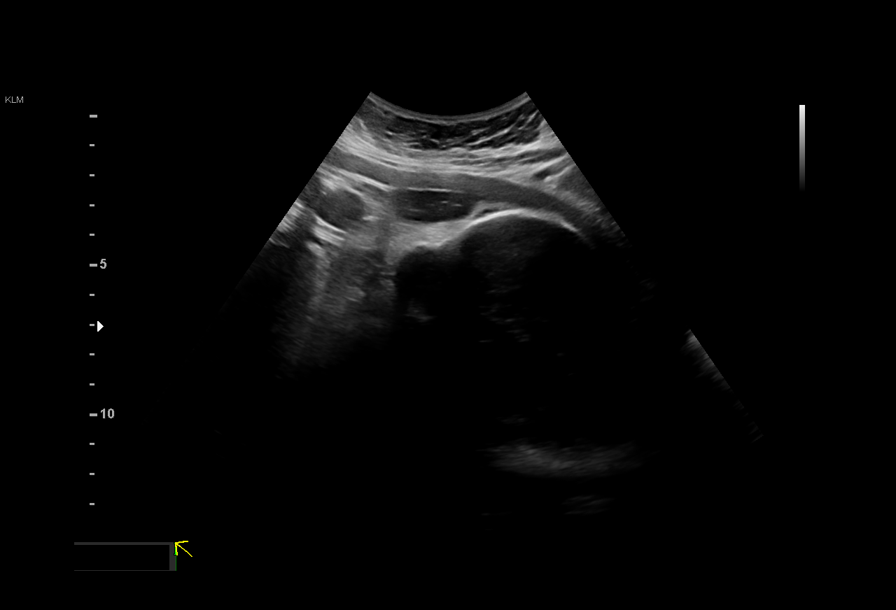
[im 3/20]
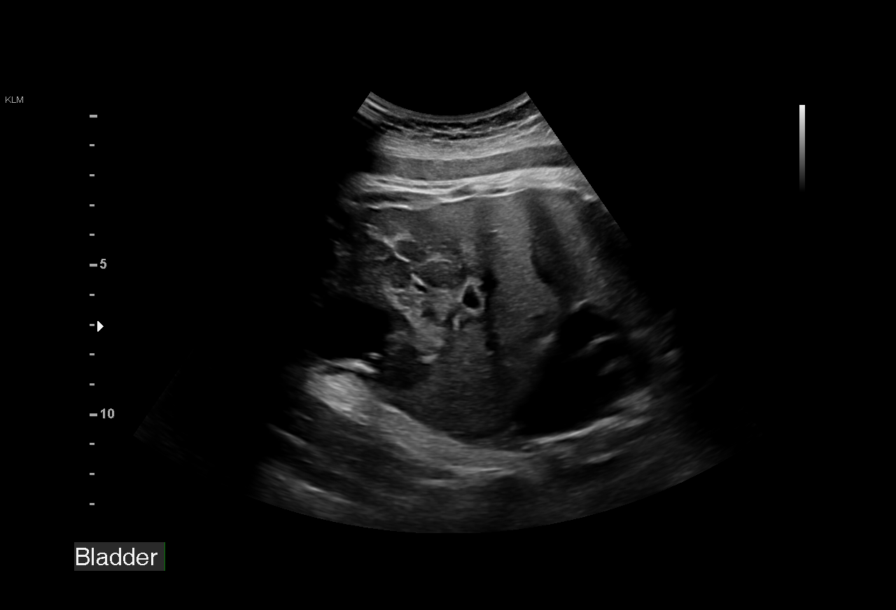
[im 5/20]
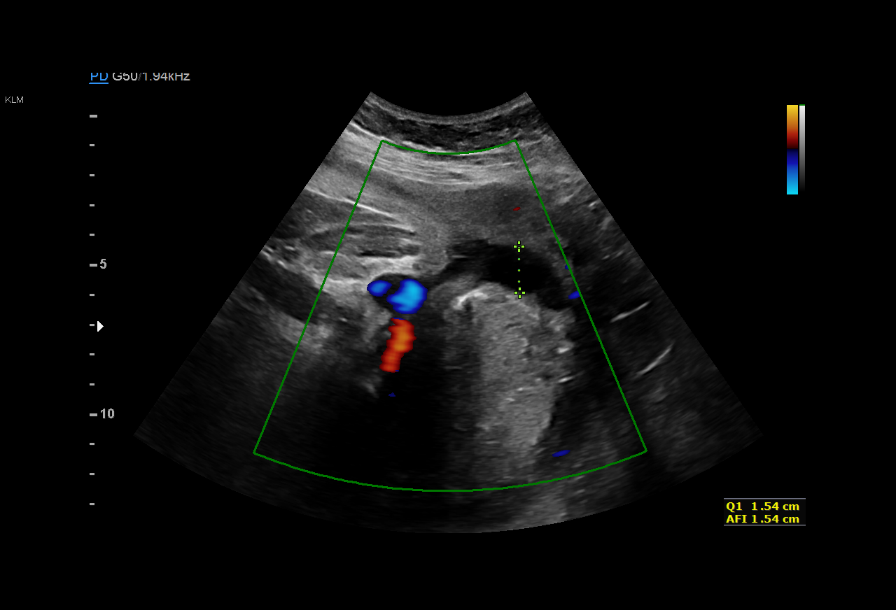
[im 6/20]
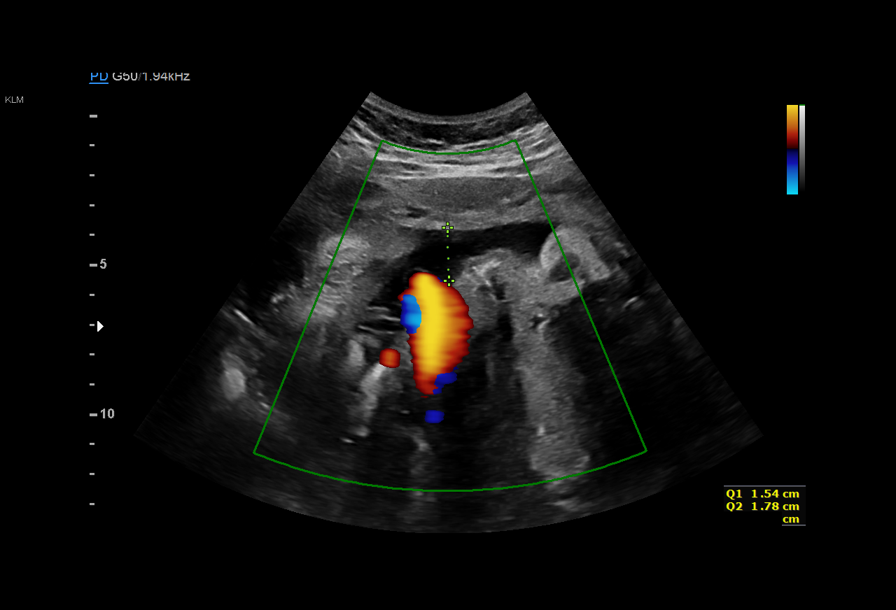
[im 8/20]
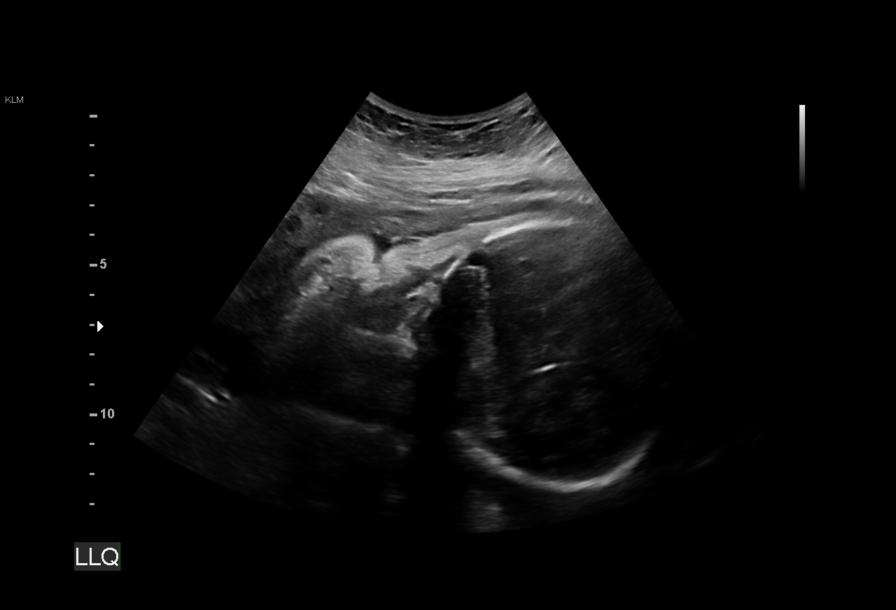
[im 10/20]
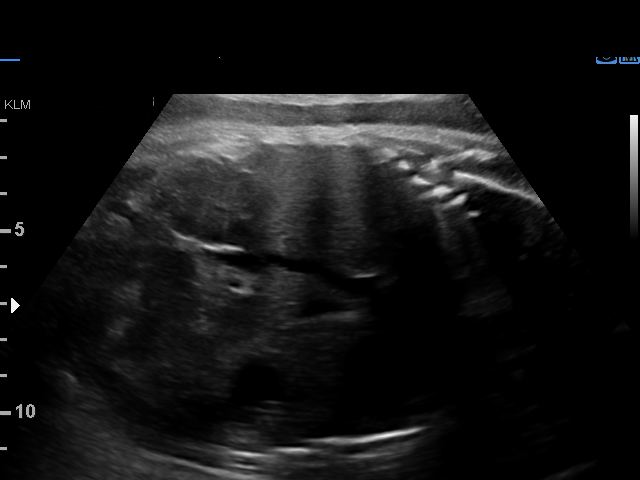
[im 11/20]
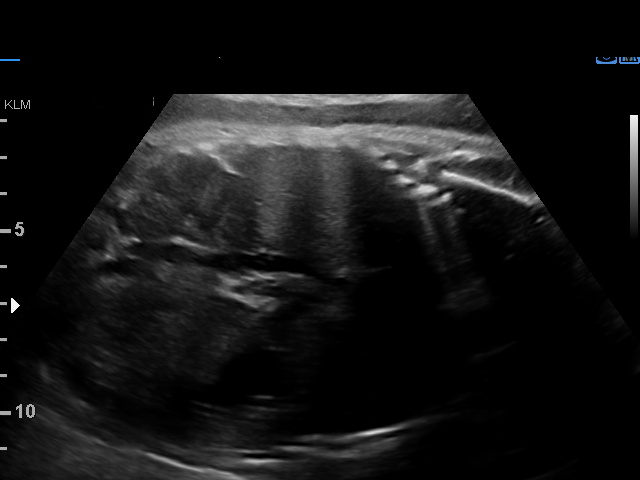
[im 13/20]
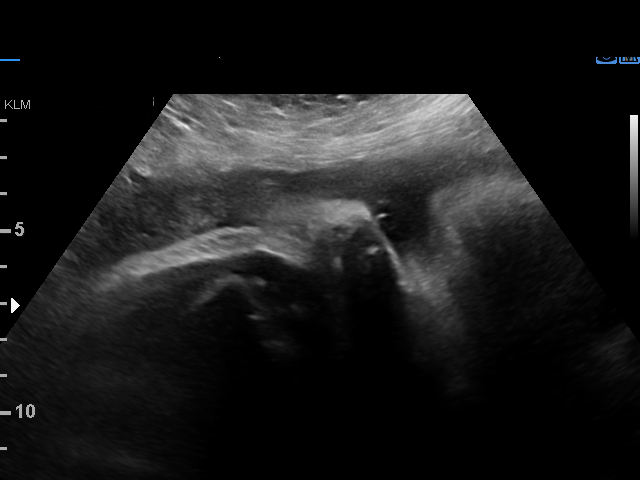
[im 15/20]
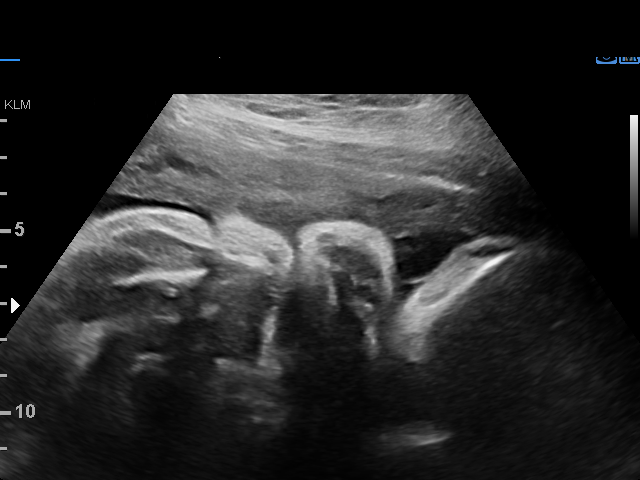
[im 16/20]
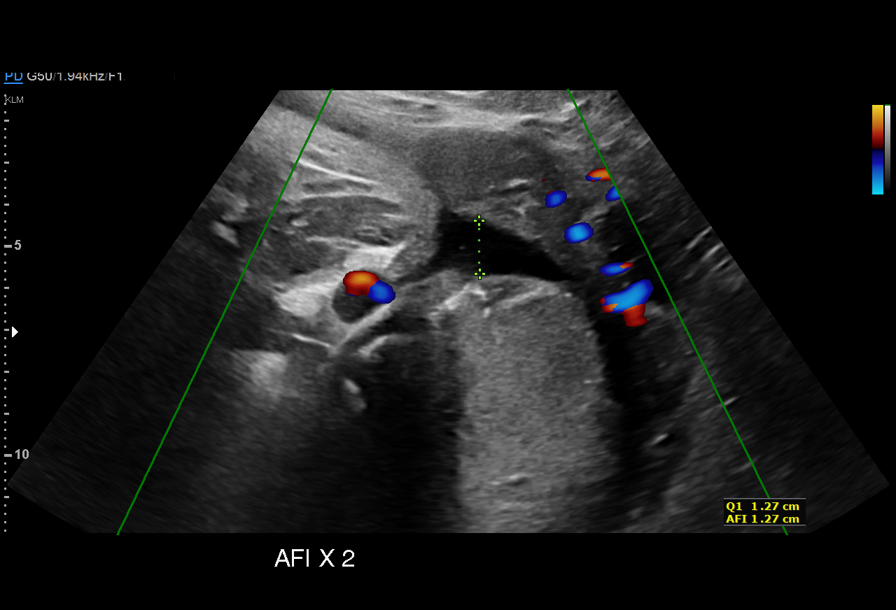
[im 18/20]
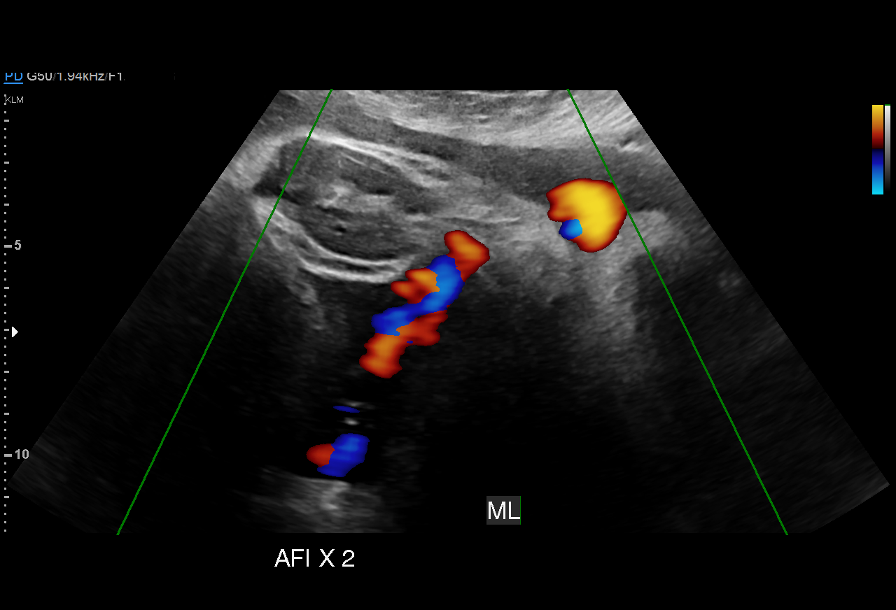
[im 20/20]
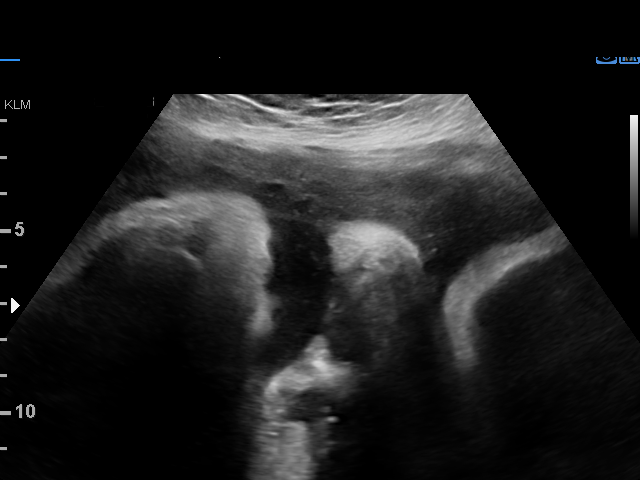

[12 of 20 positions shown; findings below may reference images not displayed]

FINDINGS: 1. Single intrauterine pregnancy.
2. Oligohydramnios with AFI of 3cm; there is no 9x9cm
pocket of amniotic fluid.
3. Normal stomach and bladder seen.
4. Biophysical profile is [DATE] (-2 for fluid).
5. Fetus is in cephalic presentation.
Recommendations

1. Oligohydramnios:
- given patient is term I recommend delivery

Results and recommendations discussed with Dr. Zjoga.

## 2020-01-23 ENCOUNTER — Ambulatory Visit: Payer: BC Managed Care – PPO | Attending: Internal Medicine

## 2020-01-23 DIAGNOSIS — Z23 Encounter for immunization: Secondary | ICD-10-CM

## 2020-01-23 NOTE — Progress Notes (Signed)
   Covid-19 Vaccination Clinic  Name:  Roya Gieselman    MRN: [id number] DOB: [birth year]  01/23/2020  Ms. Titzer was observed post Covid-19 immunization for 15 minutes without incident. She was provided with Vaccine Information Sheet and instruction to access the V-Safe system.   Ms. Leggitt was instructed to call 911 with any severe reactions post vaccine: Marland Kitchen Difficulty breathing  . Swelling of face and throat  . A fast heartbeat  . A bad rash all over body  . Dizziness and weakness   Immunizations Administered    No immunizations on file.

## 2020-02-24 NOTE — L&D Delivery Note (Signed)
Delivery Note At 8:44 AM a viable female was delivered via Vaginal, Spontaneous (Presentation:   Occiput Anterior).  APGAR: 8, 9; weight not obtained  .   Placenta status: Spontaneous, Intact.  Cord: 3 vessels with the following complications: None.  Cord pH: not obtained  Anesthesia:  none Episiotomy: None Lacerations:  2nd degree Suture Repair: 2.0 chromic Est. Blood Loss (mL):  300  Mom to postpartum.  Baby to Couplet care / Skin to Skin.  Jeani Hawking 10/16/2020, 9:05 AM

## 2020-03-19 LAB — OB RESULTS CONSOLE ANTIBODY SCREEN: Antibody Screen: NEGATIVE

## 2020-03-19 LAB — OB RESULTS CONSOLE GC/CHLAMYDIA
Chlamydia: NEGATIVE
Gonorrhea: NEGATIVE

## 2020-03-19 LAB — OB RESULTS CONSOLE ABO/RH: RH Type: POSITIVE

## 2020-03-19 LAB — OB RESULTS CONSOLE HEPATITIS B SURFACE ANTIGEN: Hepatitis B Surface Ag: NEGATIVE

## 2020-03-19 LAB — OB RESULTS CONSOLE RPR: RPR: NONREACTIVE

## 2020-03-19 LAB — OB RESULTS CONSOLE RUBELLA ANTIBODY, IGM: Rubella: IMMUNE

## 2020-03-19 LAB — OB RESULTS CONSOLE HIV ANTIBODY (ROUTINE TESTING): HIV: NONREACTIVE

## 2020-08-21 ENCOUNTER — Encounter: Payer: 59 | Attending: Obstetrics and Gynecology | Admitting: Registered"

## 2020-08-21 ENCOUNTER — Other Ambulatory Visit: Payer: Self-pay

## 2020-08-21 DIAGNOSIS — O24419 Gestational diabetes mellitus in pregnancy, unspecified control: Secondary | ICD-10-CM | POA: Insufficient documentation

## 2020-08-25 ENCOUNTER — Encounter: Payer: Self-pay | Admitting: Registered"

## 2020-08-25 DIAGNOSIS — O24419 Gestational diabetes mellitus in pregnancy, unspecified control: Secondary | ICD-10-CM | POA: Insufficient documentation

## 2020-08-25 NOTE — Progress Notes (Signed)
Patient was seen on 08/21/20 for Gestational Diabetes self-management class at the [facility]. The following learning objectives were met by the patient during this course:  States the definition of Gestational Diabetes States why dietary management is important in controlling blood glucose Describes the effects each nutrient has on blood glucose levels Demonstrates ability to create a balanced meal plan Demonstrates carbohydrate counting  States when to check blood glucose levels Demonstrates proper blood glucose monitoring techniques States the effect of stress and exercise on blood glucose levels States the importance of limiting caffeine and abstaining from alcohol and smoking  Blood glucose monitor given: Patient has meter and is checking blood sugar prior to class  Patient instructed to monitor glucose levels: FBS: 60 - <95; 1 hour: <140; 2 hour: <120  Patient received handouts: Nutrition Diabetes and Pregnancy, including carb counting list  Patient will be seen for follow-up as needed.

## 2020-10-08 ENCOUNTER — Telehealth (HOSPITAL_COMMUNITY): Payer: Self-pay | Admitting: *Deleted

## 2020-10-08 ENCOUNTER — Encounter (HOSPITAL_COMMUNITY): Payer: Self-pay | Admitting: *Deleted

## 2020-10-08 NOTE — Telephone Encounter (Signed)
Preadmission screen  

## 2020-10-16 ENCOUNTER — Encounter (HOSPITAL_COMMUNITY): Payer: Self-pay | Admitting: Obstetrics and Gynecology

## 2020-10-16 ENCOUNTER — Inpatient Hospital Stay (HOSPITAL_COMMUNITY)
Admission: AD | Admit: 2020-10-16 | Discharge: 2020-10-18 | DRG: 807 | Disposition: A | Discharge status: Home / Self Care | Payer: 59 | Attending: Obstetrics and Gynecology | Admitting: Obstetrics and Gynecology

## 2020-10-16 ENCOUNTER — Other Ambulatory Visit: Payer: Self-pay

## 2020-10-16 DIAGNOSIS — Z20822 Contact with and (suspected) exposure to covid-19: Secondary | ICD-10-CM | POA: Diagnosis present

## 2020-10-16 DIAGNOSIS — Z3A39 39 weeks gestation of pregnancy: Secondary | ICD-10-CM | POA: Diagnosis not present

## 2020-10-16 DIAGNOSIS — O99824 Streptococcus B carrier state complicating childbirth: Principal | ICD-10-CM | POA: Diagnosis present

## 2020-10-16 DIAGNOSIS — Z3490 Encounter for supervision of normal pregnancy, unspecified, unspecified trimester: Secondary | ICD-10-CM

## 2020-10-16 DIAGNOSIS — O26893 Other specified pregnancy related conditions, third trimester: Secondary | ICD-10-CM | POA: Diagnosis present

## 2020-10-16 LAB — RESP PANEL BY RT-PCR (FLU A&B, COVID) ARPGX2
Influenza A by PCR: NEGATIVE
Influenza B by PCR: NEGATIVE
SARS Coronavirus 2 by RT PCR: NEGATIVE

## 2020-10-16 LAB — CBC
HCT: 40 % (ref 36.0–46.0)
Hemoglobin: 13.2 g/dL (ref 12.0–15.0)
MCH: 29.5 pg (ref 26.0–34.0)
MCHC: 33 g/dL (ref 30.0–36.0)
MCV: 89.3 fL (ref 80.0–100.0)
Platelets: 194 10*3/uL (ref 150–400)
RBC: 4.48 MIL/uL (ref 3.87–5.11)
RDW: 15.1 % (ref 11.5–15.5)
WBC: 8.9 10*3/uL (ref 4.0–10.5)
nRBC: 0 % (ref 0.0–0.2)

## 2020-10-16 LAB — TYPE AND SCREEN
ABO/RH(D): O POS
Antibody Screen: NEGATIVE

## 2020-10-16 LAB — RPR: RPR Ser Ql: NONREACTIVE

## 2020-10-16 MED ORDER — MEASLES, MUMPS & RUBELLA VAC IJ SOLR: 0.5000 mL | Freq: Once | INTRAMUSCULAR | Status: DC

## 2020-10-16 MED ORDER — TETANUS-DIPHTH-ACELL PERTUSSIS 5-2.5-18.5 LF-MCG/0.5 IM SUSY: 0.5000 mL | PREFILLED_SYRINGE | Freq: Once | INTRAMUSCULAR | Status: DC

## 2020-10-16 MED ORDER — ONDANSETRON HCL 4 MG PO TABS: 4.0000 mg | ORAL_TABLET | ORAL | Status: DC | PRN

## 2020-10-16 MED ORDER — SODIUM CHLORIDE 0.9 % IV SOLN
2.0000 g | Freq: Once | INTRAVENOUS | Status: AC
  Administered 2020-10-16: 2 g via INTRAVENOUS
  Filled 2020-10-16: qty 2000

## 2020-10-16 MED ORDER — OXYCODONE HCL 5 MG PO TABS: 5.0000 mg | ORAL_TABLET | ORAL | Status: DC | PRN

## 2020-10-16 MED ORDER — BISACODYL 10 MG RE SUPP: 10.0000 mg | Freq: Every day | RECTAL | Status: DC | PRN

## 2020-10-16 MED ORDER — ACETAMINOPHEN 325 MG PO TABS: 650.0000 mg | ORAL_TABLET | ORAL | Status: DC | PRN

## 2020-10-16 MED ORDER — DIPHENHYDRAMINE HCL 25 MG PO CAPS: 25.0000 mg | ORAL_CAPSULE | Freq: Four times a day (QID) | ORAL | Status: DC | PRN

## 2020-10-16 MED ORDER — LACTATED RINGERS IV SOLN: 500.0000 mL | INTRAVENOUS | Status: DC | PRN

## 2020-10-16 MED ORDER — OXYCODONE HCL 5 MG PO TABS: 10.0000 mg | ORAL_TABLET | ORAL | Status: DC | PRN

## 2020-10-16 MED ORDER — LIDOCAINE HCL (PF) 1 % IJ SOLN
30.0000 mL | INTRAMUSCULAR | Status: AC | PRN
Start: 2020-10-16 — End: 2020-10-16
  Administered 2020-10-16: 30 mL via SUBCUTANEOUS
  Filled 2020-10-16: qty 30

## 2020-10-16 MED ORDER — WITCH HAZEL-GLYCERIN EX PADS: 1.0000 "application " | MEDICATED_PAD | CUTANEOUS | Status: DC | PRN

## 2020-10-16 MED ORDER — DIBUCAINE (PERIANAL) 1 % EX OINT: 1.0000 "application " | TOPICAL_OINTMENT | CUTANEOUS | Status: DC | PRN

## 2020-10-16 MED ORDER — IBUPROFEN 600 MG PO TABS
600.0000 mg | ORAL_TABLET | Freq: Four times a day (QID) | ORAL | Status: DC
  Administered 2020-10-16 – 2020-10-17 (×3): 600 mg via ORAL
  Filled 2020-10-16 (×8): qty 1

## 2020-10-16 MED ORDER — MEDROXYPROGESTERONE ACETATE 150 MG/ML IM SUSP: 150.0000 mg | INTRAMUSCULAR | Status: DC | PRN

## 2020-10-16 MED ORDER — ONDANSETRON HCL 4 MG/2ML IJ SOLN: 4.0000 mg | Freq: Four times a day (QID) | INTRAMUSCULAR | Status: DC | PRN

## 2020-10-16 MED ORDER — SIMETHICONE 80 MG PO CHEW: 80.0000 mg | CHEWABLE_TABLET | ORAL | Status: DC | PRN

## 2020-10-16 MED ORDER — FLEET ENEMA 7-19 GM/118ML RE ENEM: 1.0000 | ENEMA | Freq: Every day | RECTAL | Status: DC | PRN

## 2020-10-16 MED ORDER — BENZOCAINE-MENTHOL 20-0.5 % EX AERO: 1.0000 "application " | INHALATION_SPRAY | CUTANEOUS | Status: DC | PRN

## 2020-10-16 MED ORDER — OXYTOCIN BOLUS FROM INFUSION
333.0000 mL | Freq: Once | INTRAVENOUS | Status: AC
  Administered 2020-10-16: 333 mL via INTRAVENOUS

## 2020-10-16 MED ORDER — FLEET ENEMA 7-19 GM/118ML RE ENEM: 1.0000 | ENEMA | RECTAL | Status: DC | PRN

## 2020-10-16 MED ORDER — SODIUM CHLORIDE 0.9 % IV SOLN
5.0000 10*6.[IU] | Freq: Once | INTRAVENOUS | Status: DC
  Filled 2020-10-16: qty 5

## 2020-10-16 MED ORDER — FENTANYL CITRATE (PF) 100 MCG/2ML IJ SOLN
50.0000 ug | INTRAMUSCULAR | Status: DC | PRN
  Administered 2020-10-16: 100 ug via INTRAVENOUS

## 2020-10-16 MED ORDER — SOD CITRATE-CITRIC ACID 500-334 MG/5ML PO SOLN: 30.0000 mL | ORAL | Status: DC | PRN

## 2020-10-16 MED ORDER — ACETAMINOPHEN 325 MG PO TABS
650.0000 mg | ORAL_TABLET | ORAL | Status: DC | PRN
Start: 2020-10-16 — End: 2020-10-16

## 2020-10-16 MED ORDER — OXYCODONE-ACETAMINOPHEN 5-325 MG PO TABS: 1.0000 | ORAL_TABLET | ORAL | Status: DC | PRN

## 2020-10-16 MED ORDER — ONDANSETRON HCL 4 MG/2ML IJ SOLN: 4.0000 mg | INTRAMUSCULAR | Status: DC | PRN

## 2020-10-16 MED ORDER — OXYTOCIN-SODIUM CHLORIDE 30-0.9 UT/500ML-% IV SOLN
2.5000 [IU]/h | INTRAVENOUS | Status: DC
  Administered 2020-10-16: 2.5 [IU]/h via INTRAVENOUS
  Filled 2020-10-16: qty 500

## 2020-10-16 MED ORDER — FENTANYL CITRATE (PF) 100 MCG/2ML IJ SOLN
INTRAMUSCULAR | Status: AC
  Filled 2020-10-16: qty 2

## 2020-10-16 MED ORDER — LACTATED RINGERS IV SOLN: INTRAVENOUS | Status: DC

## 2020-10-16 MED ORDER — OXYCODONE-ACETAMINOPHEN 5-325 MG PO TABS: 2.0000 | ORAL_TABLET | ORAL | Status: DC | PRN

## 2020-10-16 MED ORDER — SENNOSIDES-DOCUSATE SODIUM 8.6-50 MG PO TABS
2.0000 | ORAL_TABLET | Freq: Every day | ORAL | Status: DC
  Administered 2020-10-17: 2 via ORAL
  Filled 2020-10-16 (×2): qty 2

## 2020-10-16 MED ORDER — COCONUT OIL OIL: 1.0000 "application " | TOPICAL_OIL | Status: DC | PRN

## 2020-10-16 MED ORDER — PENICILLIN G POT IN DEXTROSE 60000 UNIT/ML IV SOLN
3.0000 10*6.[IU] | INTRAVENOUS | Status: DC
  Filled 2020-10-16 (×2): qty 50

## 2020-10-16 MED ORDER — PRENATAL MULTIVITAMIN CH
1.0000 | ORAL_TABLET | Freq: Every day | ORAL | Status: DC
  Administered 2020-10-16 – 2020-10-17 (×2): 1 via ORAL
  Filled 2020-10-16 (×3): qty 1

## 2020-10-16 MED ORDER — ZOLPIDEM TARTRATE 5 MG PO TABS: 5.0000 mg | ORAL_TABLET | Freq: Every evening | ORAL | Status: DC | PRN

## 2020-10-16 NOTE — H&P (Signed)
Terri Evans is a 35 y.o. G 4 P 2 at 39 weeks presents in active labor progressed from 5 to 9 and now on L and d  OB History     Gravida  4   Para  2   Term  2   Preterm      AB  1   Living  2      SAB      IAB  1   Ectopic      Multiple  0   Live Births  2          Past Medical History:  Diagnosis Date   Medical history non-contributory    Past Surgical History:  Procedure Laterality Date   NO PAST SURGERIES     Family History: family history includes Diabetes in her father and mother; Heart disease in her maternal grandmother; Hypertension in her father and mother. Social History:  reports that she does not drink alcohol and does not use drugs. No history on file for tobacco use.     Maternal Diabetes: No Genetic Screening: Normal Maternal Ultrasounds/Referrals: Normal Fetal Ultrasounds or other Referrals:  None Maternal Substance Abuse:  No Significant Maternal Medications:  None Significant Maternal Lab Results:  GBS Positive Other Comments:  None  Review of Systems  All other systems reviewed and are negative. Maternal Medical History:  Reason for admission: Contractions.    Dilation: 9 Effacement (%): 100 Station: 0 Exam by:: Trudee Chirino Blood pressure 123/75, pulse 62, temperature 98.6 F (37 C), temperature source Oral, resp. rate 15, height 5\' 7"  (1.702 m), weight 71.8 kg, SpO2 100 %, unknown if currently breastfeeding. Maternal Exam:  Uterine Assessment: Contraction strength is moderate.  Contraction frequency is regular.  Abdomen: Fetal presentation: vertex   Fetal Exam Fetal State Assessment: Category I - tracings are normal.  Physical Exam Vitals and nursing note reviewed. Exam conducted with a chaperone present.  Constitutional:      Appearance: Normal appearance.  HENT:     Head: Normocephalic.  Cardiovascular:     Rate and Rhythm: Normal rate and regular rhythm.  Pulmonary:     Effort: Pulmonary effort is normal.   Musculoskeletal:     Cervical back: Normal range of motion.  Neurological:     Mental Status: She is alert.    Prenatal labs: ABO, Rh: O/Positive/-- (01/25 0000) Antibody: Negative (01/25 0000) Rubella: Immune (01/25 0000) RPR: Nonreactive (01/25 0000)  HBsAg: Negative (01/25 0000)  HIV: Non-reactive (01/25 0000)  GBS:     Assessment/Plan: IUP at term Labor  Gbs+ Antibiotics  Anticipate NSVD 07-25-1983 10/16/2020, 7:53 AM

## 2020-10-16 NOTE — Lactation Note (Addendum)
This note was copied from a baby's chart. Lactation Consultation Note  Patient Name: Boy Paulette Segar [id number] Date: 10/16/2020 Reason for consult: L&D Initial assessment;Term;Other (Comment) (LC L/D 50 mins PP, P3 , experienced BF / baby latched when LC entered the room with depth and swallows / per mom comfortable and baby still feeding at 30 mins) Age: 37 mins old  Baby fed 30 mins and per LD RN baby relatched after weight .        Maternal Data Does the patient have breastfeeding experience prior to this delivery?: Yes How long did the patient breastfeed?: per mom 1st and 2nd baby 2 years each for BF  Feeding Mother's Current Feeding Choice: Breast Milk  LATCH Score- baby latched when LC arrived  Latch:  (latched with depth / flanged lips)  Audible Swallowing:  (swallows noted/ baby released few seconds and clostrum noted)     Comfort (Breast/Nipple):  (breast soft)  Hold (Positioning):  (mom independent with latch)      Lactation Tools Discussed/Used    Interventions Interventions: Breast feeding basics reviewed;Skin to skin;Education  Discharge    Consult Status Consult Status: Follow-up from L&D Date: 10/16/20 Follow-up type: In-patient    Matilde Sprang Nallely Yost 10/16/2020, 9:57 AM

## 2020-10-16 NOTE — MAU Note (Signed)
..  Terri Evans is a 35 y.o. at [redacted]w[redacted]d here in MAU reporting: contractions every 4 minutes since 4:30 am. Denies vaginal bleeding or leaking of fluid. +FM.   Pain score: 4/10

## 2020-10-17 LAB — CBC
HCT: 36.3 % (ref 36.0–46.0)
Hemoglobin: 12 g/dL (ref 12.0–15.0)
MCH: 29.3 pg (ref 26.0–34.0)
MCHC: 33.1 g/dL (ref 30.0–36.0)
MCV: 88.8 fL (ref 80.0–100.0)
Platelets: 173 10*3/uL (ref 150–400)
RBC: 4.09 MIL/uL (ref 3.87–5.11)
RDW: 14.9 % (ref 11.5–15.5)
WBC: 10.2 10*3/uL (ref 4.0–10.5)
nRBC: 0 % (ref 0.0–0.2)

## 2020-10-17 NOTE — Progress Notes (Signed)
Postpartum Progress Note  Post Partum Day 1 s/p spontaneous vaginal delivery.  Patient reports well-controlled pain, ambulating without difficulty, voiding spontaneously, tolerating PO.  Vaginal bleeding is appropriate.   Objective: Blood pressure 98/61, pulse 60, temperature 98.3 F (36.8 C), temperature source Oral, resp. rate 16, height 5\' 7"  (1.702 m), weight 71.7 kg, SpO2 100 %, unknown if currently breastfeeding.  Physical Exam:  General: alert and no distress Lochia: appropriate Uterine Fundus: firm DVT Evaluation: No evidence of DVT seen on physical exam.  Recent Labs    10/16/20 0728 10/17/20 0441  HGB 13.2 12.0  HCT 40.0 36.3    Assessment/Plan: Postpartum Day 1, s/p vaginal delivery. Baby boy - parents decline circ. Will stay for 48 hrs after delivery for inadequate GBS prophylaxis Continue routine postpartum care Lactation following Anticipate discharge home tomorrow   LOS: 1 day   10/19/20 10/17/2020, 8:21 AM

## 2020-10-17 NOTE — Lactation Note (Signed)
This note was copied from a baby's chart. Lactation Consultation Note  Patient Name: Terri Evans [id number] Date: 10/17/2020 Reason for consult: Initial assessment;Term Age:37 hours   Initial Consult:  Attempted to visit with mother, however, she was asleep.  LC to follow up tomorrow.   Maternal Data    Feeding    LATCH Score                    Lactation Tools Discussed/Used    Interventions    Discharge    Consult Status Consult Status: Follow-up from L&D Date: 10/18/20 Follow-up type: In-patient    Dora Sims 10/17/2020, 6:19 PM

## 2020-10-18 ENCOUNTER — Inpatient Hospital Stay (HOSPITAL_COMMUNITY): Payer: 59

## 2020-10-18 ENCOUNTER — Inpatient Hospital Stay (HOSPITAL_COMMUNITY): Admission: AD | Admit: 2020-10-18 | Payer: 59 | Source: Home / Self Care | Admitting: Obstetrics & Gynecology

## 2020-10-18 MED ORDER — IBUPROFEN 600 MG PO TABS: 600.0000 mg | ORAL_TABLET | Freq: Four times a day (QID) | ORAL | 0 refills | Status: AC

## 2020-10-18 NOTE — Discharge Summary (Signed)
Postpartum Discharge Summary  Patient Name: Terri Evans DOB: Dec 06, 1985 MRN: [id number]  Date of admission: 10/16/2020 Delivery date:10/16/2020  Delivering provider: Dian Queen  Date of discharge: 10/18/2020  Admitting diagnosis: Term pregnancy [Z34.90] NSVD (normal spontaneous vaginal delivery) [O80] Intrauterine pregnancy: [redacted]w[redacted]d    Secondary diagnosis:  Active Problems:   Term pregnancy   NSVD (normal spontaneous vaginal delivery)  Additional problems: none    Discharge diagnosis: Term Pregnancy Delivered                                              Post partum procedures: n/a Augmentation: N/A Complications: None  Hospital course: Onset of Labor With Vaginal Delivery      35y.o. yo [id number] admitted in Active Labor on 10/16/2020. Patient had an uncomplicated labor course as follows:  Membrane Rupture Time/Date:  ,   Delivery Method:Vaginal, Spontaneous  Episiotomy: None  Lacerations:    Patient had an uncomplicated postpartum course.  She is ambulating, tolerating a regular diet, passing flatus, and urinating well. Patient is discharged home in stable condition on 10/18/20.  Newborn Data: Birth date:10/16/2020  Birth time:8:44 AM  Gender:Female  Living status:Living  Apgars:8 ,9  Weight:3515 g   Magnesium Sulfate received: No BMZ received: No Rhophylac:No MMR:No T-DaP:Given prenatally Flu: No Transfusion:No  Physical exam  Vitals:   10/17/20 0650 10/17/20 1544 10/17/20 2240 10/18/20 0634  BP: 98/61 101/65 103/61 (!) 95/58  Pulse: 60 64 61 66  Resp: 16 17 18 16   Temp: 98.3 F (36.8 C) 98 F (36.7 C) 98.1 F (36.7 C) 98.4 F (36.9 C)  TempSrc: Oral Oral Oral Oral  SpO2: 100% 99% 99% 100%  Weight:      Height:       General: alert, cooperative, and no distress Lochia: appropriate Uterine Fundus: firm Incision: Healing well with no significant drainage, No significant erythema DVT Evaluation: No evidence of DVT seen on physical  exam. Negative Homan's sign. No cords or calf tenderness. Labs: Lab Results  Component Value Date   WBC 10.2 10/17/2020   HGB 12.0 10/17/2020   HCT 36.3 10/17/2020   MCV 88.8 10/17/2020   PLT 173 10/17/2020   No flowsheet data found. Edinburgh Score: Edinburgh Postnatal Depression Scale Screening Tool 10/16/2020  I have been able to laugh and see the funny side of things. 0  I have looked forward with enjoyment to things. 0  I have blamed myself unnecessarily when things went wrong. 1  I have been anxious or worried for no good reason. 2  I have felt scared or panicky for no good reason. 1  Things have been getting on top of me. 1  I have been so unhappy that I have had difficulty sleeping. 0  I have felt sad or miserable. 0  I have been so unhappy that I have been crying. 0  The thought of harming myself has occurred to me. 0  Edinburgh Postnatal Depression Scale Total 5     After visit meds:  Allergies as of 10/18/2020   No Known Allergies      Medication List     STOP taking these medications    calcium carbonate 500 MG chewable tablet Commonly known as: TUMS - dosed in mg elemental calcium       TAKE these medications    acetaminophen  325 MG tablet Commonly known as: TYLENOL Take 650 mg by mouth every 6 (six) hours as needed for mild pain or headache.   ibuprofen 600 MG tablet Commonly known as: ADVIL Take 1 tablet (600 mg total) by mouth every 6 (six) hours.   prenatal vitamin w/FE, FA 27-1 MG Tabs tablet Take 1 tablet by mouth daily at 12 noon.         Discharge home in stable condition Infant Feeding: Breast Infant Disposition:home with mother Discharge instruction: per After Visit Summary and Postpartum booklet. Activity: Advance as tolerated. Pelvic rest for 6 weeks.  Diet: routine diet Future Appointments:No future appointments. Follow up Visit:6 weeks PPV   10/18/2020 Linda Hedges, DO

## 2020-10-18 NOTE — Progress Notes (Signed)
Post Partum Day 2 Subjective: no complaints, up ad lib, voiding, and tolerating PO  Objective: Blood pressure (!) 95/58, pulse 66, temperature 98.4 F (36.9 C), temperature source Oral, resp. rate 16, height 5\' 7"  (1.702 m), weight 71.7 kg, SpO2 100 %, unknown if currently breastfeeding.  Physical Exam:  General: alert, cooperative, and appears stated age Lochia: appropriate Uterine Fundus: firm Incision: healing well, no significant drainage DVT Evaluation: No evidence of DVT seen on physical exam. Negative Homan's sign. No cords or calf tenderness.  Recent Labs    10/16/20 0728 10/17/20 0441  HGB 13.2 12.0  HCT 40.0 36.3    Assessment/Plan: Discharge home and Breastfeeding Declines circ.   LOS: 2 days   10/19/20 10/18/2020, 8:59 AM

## 2020-10-18 NOTE — Lactation Note (Addendum)
This note was copied from a baby's chart. Lactation Consultation Note  Patient Name: Terri Evans [id number] Date: 10/18/2020 Reason for consult: Follow-up assessment Age:37 hours  P3, Baby breastfeeding in side lying position upon entering. Mother concerned due to baby wanted to breastfeed frequently.  Reviewed cluster feeding, supply and demand. Feed on demand with cues.  Goal 8-12+ times per day after first 24 hrs.  Place baby STS if not cueing.  Suggest if mother continues to worry to spoon feed extra colostrum or pump and give volume back once home.  Encouraged mother to offer breast before formula.  Reviewed engorgement care and monitoring voids/stools. Provided lactation brochure for resources.   Feeding Mother's Current Feeding Choice: Breast Milk and Formula Nipple Type: Slow - flow  LATCH Score Latch: Grasps breast easily, tongue down, lips flanged, rhythmical sucking. (Latched and unlatched during consult)  Audible Swallowing: A few with stimulation  Type of Nipple: Everted at rest and after stimulation  Comfort (Breast/Nipple): Soft / non-tender  Hold (Positioning): No assistance needed to correctly position infant at breast.  LATCH Score: 9   Lactation Tools Discussed/Used    Interventions Interventions: Education;Breast feeding basics reviewed  Discharge Discharge Education: Engorgement and breast care;Warning signs for feeding baby Pump: Personal;DEBP  Consult Status Consult Status: Complete Date: 10/18/20    Dahlia Byes [facility] 10/18/2020, 7:01 AM

## 2020-10-18 NOTE — Discharge Instructions (Signed)
Call MD for T>100.4, heavy vaginal bleeding, severe abdominal pain, or respiratory distress.  Call office to schedule postpartum visit in 6 weeks.  Pelvic rest x 6 weeks.   

## 2020-10-23 ENCOUNTER — Inpatient Hospital Stay (HOSPITAL_COMMUNITY): Admit: 2020-10-23 | Payer: Self-pay

## 2020-10-29 ENCOUNTER — Telehealth (HOSPITAL_COMMUNITY): Payer: Self-pay | Admitting: *Deleted

## 2020-10-29 NOTE — Telephone Encounter (Signed)
Patient voiced no questions or concerns regarding her own health. EPDS = 2. Patient voiced no questions or concerns regarding baby at this time. Patient requested RN email information on hospital's virtual breastfeeding and pumping support groups. Email sent. Deforest Hoyles, RN 10/29/20, [phone].
